# Patient Record
Sex: Female | Born: 1970 | Race: Black or African American | Hispanic: No | Smoking: Former smoker
Health system: Southern US, Community
[De-identification: ages and names within clinical notes are randomized; demographics above are authoritative.]

## PROBLEM LIST (undated history)

## (undated) DIAGNOSIS — M1990 Unspecified osteoarthritis, unspecified site: Secondary | ICD-10-CM

## (undated) DIAGNOSIS — R350 Frequency of micturition: Secondary | ICD-10-CM

## (undated) DIAGNOSIS — D649 Anemia, unspecified: Secondary | ICD-10-CM

## (undated) DIAGNOSIS — D259 Leiomyoma of uterus, unspecified: Secondary | ICD-10-CM

## (undated) DIAGNOSIS — IMO0001 Reserved for inherently not codable concepts without codable children: Secondary | ICD-10-CM

## (undated) DIAGNOSIS — R32 Unspecified urinary incontinence: Secondary | ICD-10-CM

## (undated) HISTORY — DX: Unspecified urinary incontinence: R32

## (undated) HISTORY — DX: Leiomyoma of uterus, unspecified: D25.9

## (undated) HISTORY — DX: Anemia, unspecified: D64.9

## (undated) HISTORY — PX: OTHER SURGICAL HISTORY: SHX169

---

## 1993-09-17 HISTORY — PX: KNEE ARTHROSCOPY: SUR90

## 1994-09-17 HISTORY — PX: PELVIC LAPAROSCOPY: SHX162

## 1996-09-17 DIAGNOSIS — D259 Leiomyoma of uterus, unspecified: Secondary | ICD-10-CM

## 1996-09-17 HISTORY — DX: Leiomyoma of uterus, unspecified: D25.9

## 1998-03-01 ENCOUNTER — Other Ambulatory Visit: Admission: RE | Admit: 1998-03-01 | Discharge: 1998-03-01 | Payer: Self-pay | Admitting: Obstetrics and Gynecology

## 1998-04-01 ENCOUNTER — Other Ambulatory Visit: Admission: RE | Admit: 1998-04-01 | Discharge: 1998-04-01 | Payer: Self-pay | Admitting: Obstetrics and Gynecology

## 1998-07-22 ENCOUNTER — Ambulatory Visit (HOSPITAL_COMMUNITY): Admission: RE | Admit: 1998-07-22 | Discharge: 1998-07-22 | Payer: Self-pay | Admitting: Obstetrics and Gynecology

## 1998-07-22 ENCOUNTER — Encounter: Payer: Self-pay | Admitting: Obstetrics and Gynecology

## 1998-09-25 ENCOUNTER — Inpatient Hospital Stay (HOSPITAL_COMMUNITY): Admission: AD | Admit: 1998-09-25 | Discharge: 1998-09-25 | Payer: Self-pay | Admitting: Obstetrics and Gynecology

## 1998-09-27 ENCOUNTER — Inpatient Hospital Stay (HOSPITAL_COMMUNITY): Admission: AD | Admit: 1998-09-27 | Discharge: 1998-09-27 | Payer: Self-pay | Admitting: Obstetrics & Gynecology

## 1998-10-04 ENCOUNTER — Inpatient Hospital Stay (HOSPITAL_COMMUNITY): Admission: AD | Admit: 1998-10-04 | Discharge: 1998-10-07 | Payer: Self-pay | Admitting: Obstetrics and Gynecology

## 2001-09-29 ENCOUNTER — Other Ambulatory Visit: Admission: RE | Admit: 2001-09-29 | Discharge: 2001-09-29 | Payer: Self-pay | Admitting: Obstetrics and Gynecology

## 2002-10-15 ENCOUNTER — Other Ambulatory Visit: Admission: RE | Admit: 2002-10-15 | Discharge: 2002-10-15 | Payer: Self-pay | Admitting: Obstetrics and Gynecology

## 2004-01-21 ENCOUNTER — Encounter (INDEPENDENT_AMBULATORY_CARE_PROVIDER_SITE_OTHER): Payer: Self-pay | Admitting: Specialist

## 2004-01-21 ENCOUNTER — Inpatient Hospital Stay (HOSPITAL_COMMUNITY): Admission: RE | Admit: 2004-01-21 | Discharge: 2004-01-24 | Payer: Self-pay | Admitting: Obstetrics and Gynecology

## 2004-01-21 HISTORY — PX: TUBAL LIGATION: SHX77

## 2004-01-25 ENCOUNTER — Encounter: Admission: RE | Admit: 2004-01-25 | Discharge: 2004-02-24 | Payer: Self-pay | Admitting: Obstetrics and Gynecology

## 2004-02-25 ENCOUNTER — Encounter: Admission: RE | Admit: 2004-02-25 | Discharge: 2004-03-26 | Payer: Self-pay | Admitting: Obstetrics and Gynecology

## 2004-03-02 ENCOUNTER — Other Ambulatory Visit: Admission: RE | Admit: 2004-03-02 | Discharge: 2004-03-02 | Payer: Self-pay | Admitting: Obstetrics and Gynecology

## 2009-03-15 ENCOUNTER — Emergency Department (HOSPITAL_BASED_OUTPATIENT_CLINIC_OR_DEPARTMENT_OTHER): Admission: EM | Admit: 2009-03-15 | Discharge: 2009-03-16 | Payer: Self-pay | Admitting: Emergency Medicine

## 2009-03-16 ENCOUNTER — Ambulatory Visit: Payer: Self-pay | Admitting: Radiology

## 2009-07-14 ENCOUNTER — Ambulatory Visit (HOSPITAL_COMMUNITY): Admission: RE | Admit: 2009-07-14 | Discharge: 2009-07-14 | Payer: Self-pay | Admitting: Pulmonary Disease

## 2010-07-13 IMAGING — US US RENAL
1 series · 14 of 25 positions shown · non-contrast
Comparison: 03/16/2009 study.

CLINICAL DATA: History of hematuria.

RENAL/URINARY TRACT ULTRASOUND COMPLETE

[Series 1: us renal · 0.30mm/px · 14 of 33 slices shown]
[im 1/33]
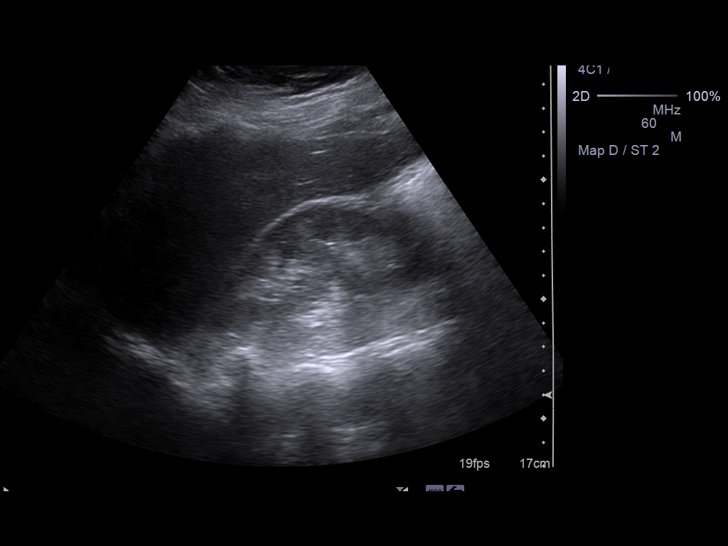
[im 3/33]
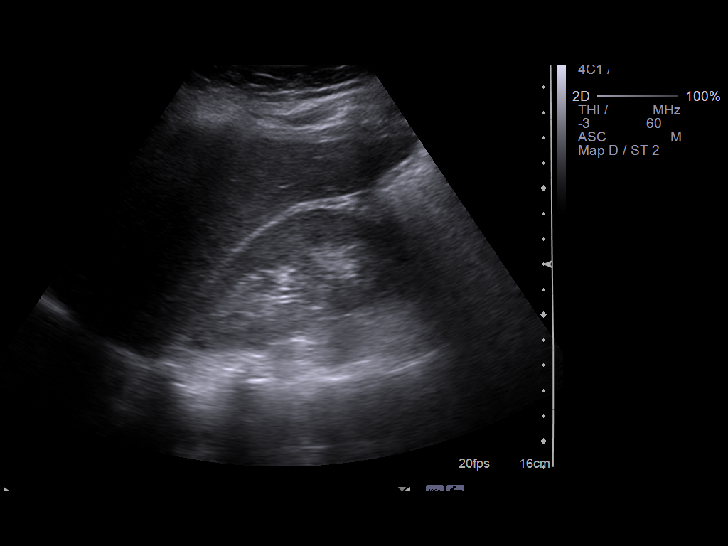
[im 6/33]
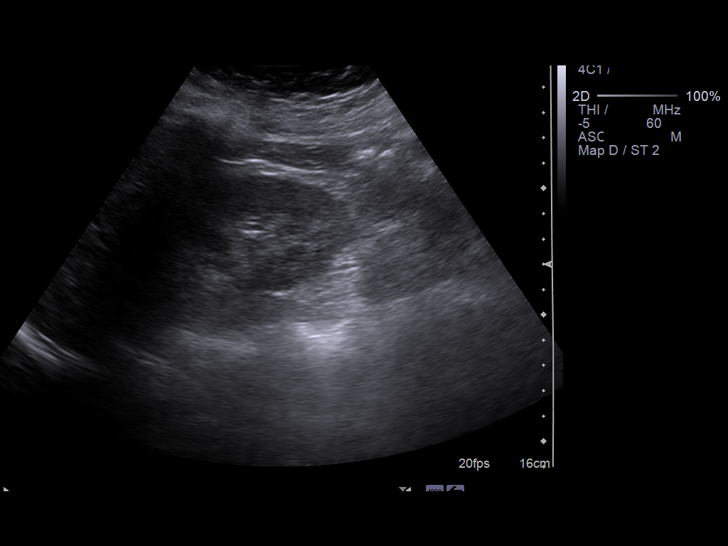
[im 9/33]
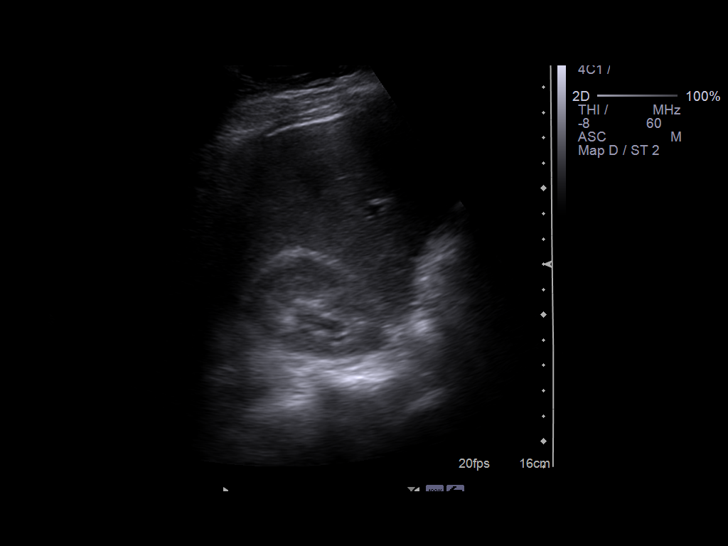
[im 11/33]
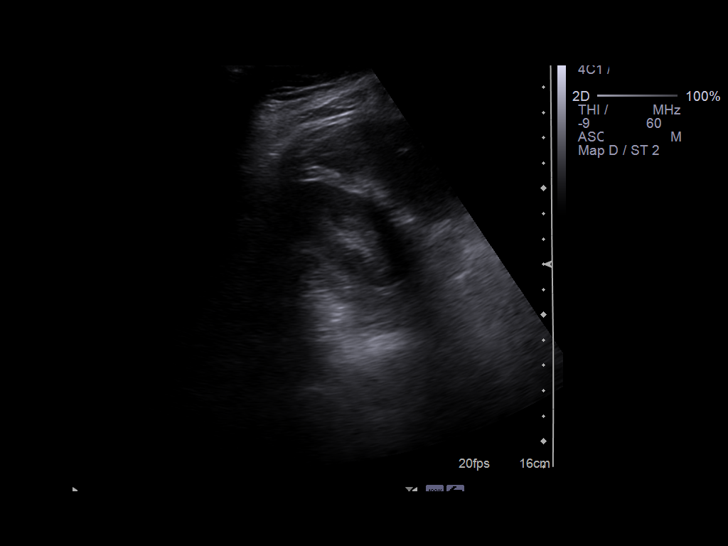
[im 13/33]
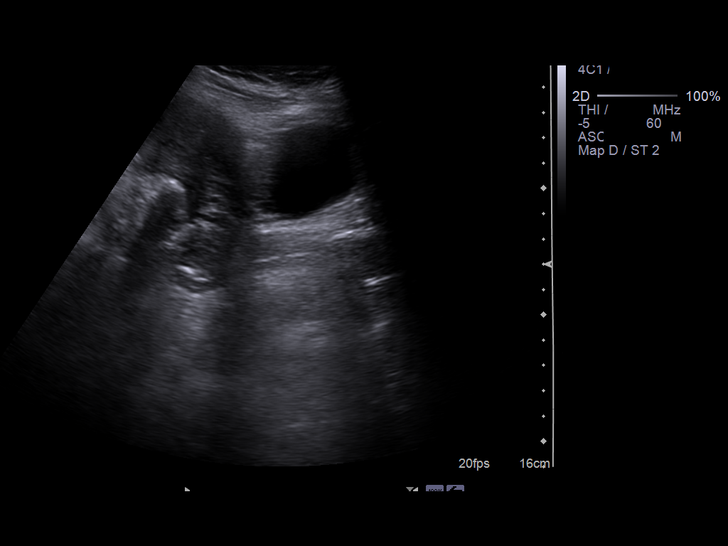
[im 15/33]
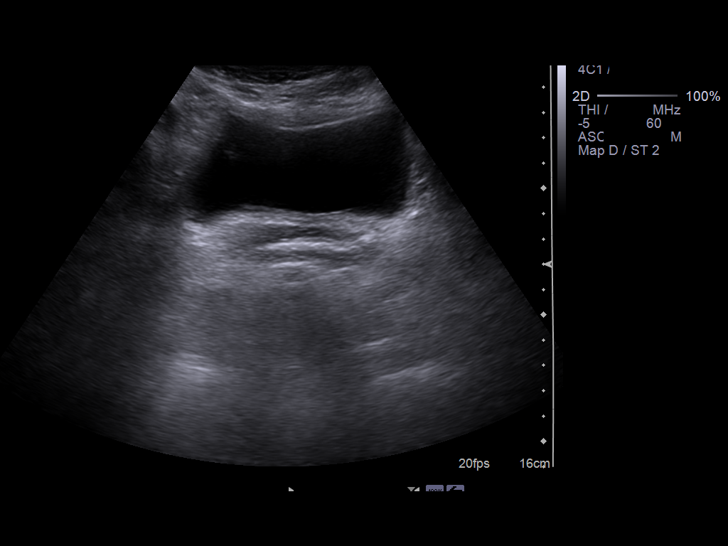
[im 18/33]
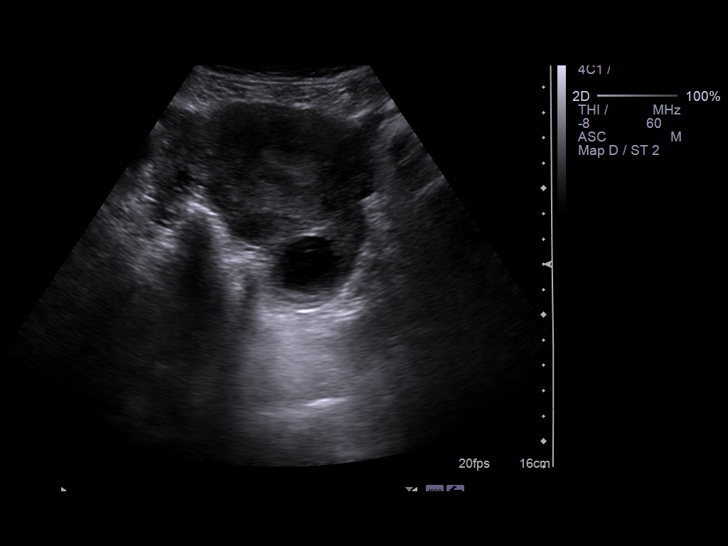
[im 21/33]
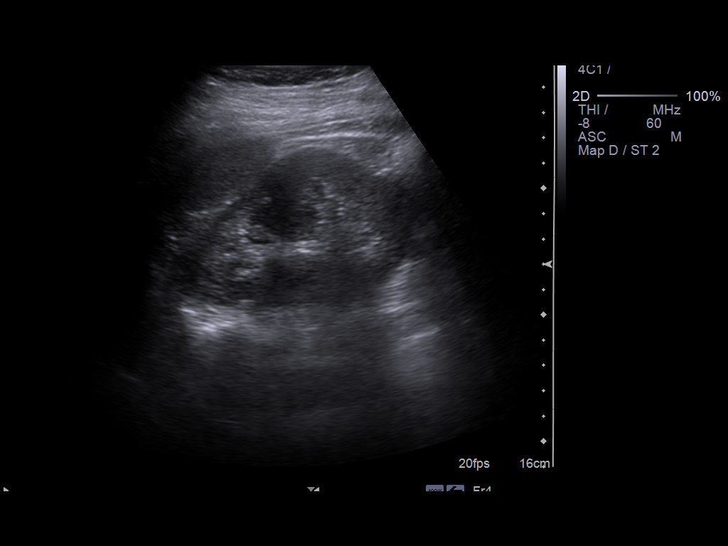
[im 22/33]
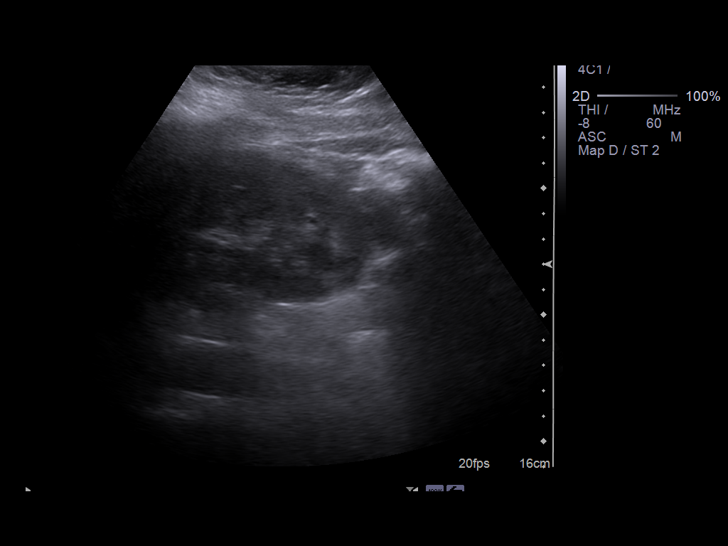
[im 25/33]
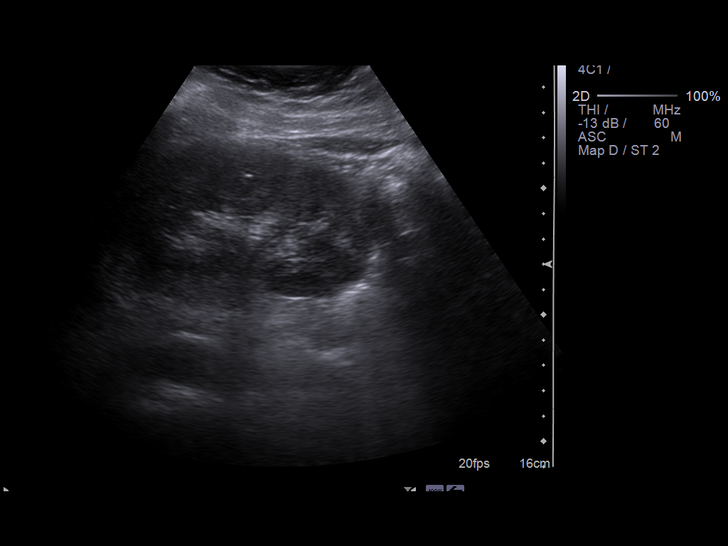
[im 27/33]
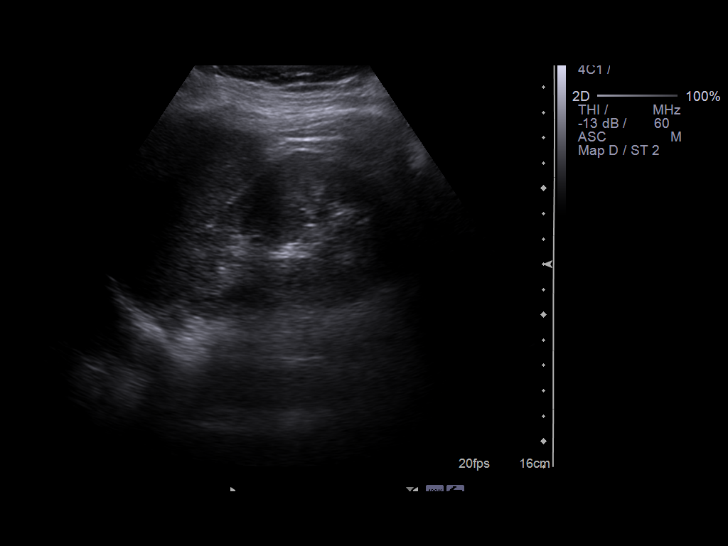
[im 30/33]
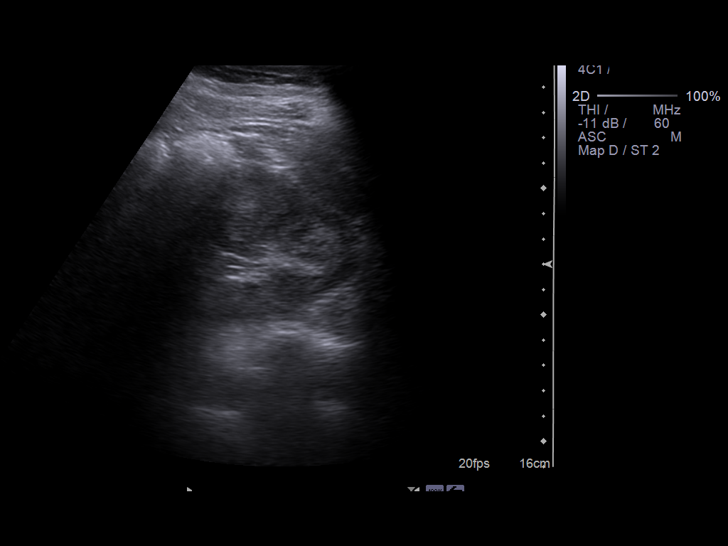
[im 33/33]
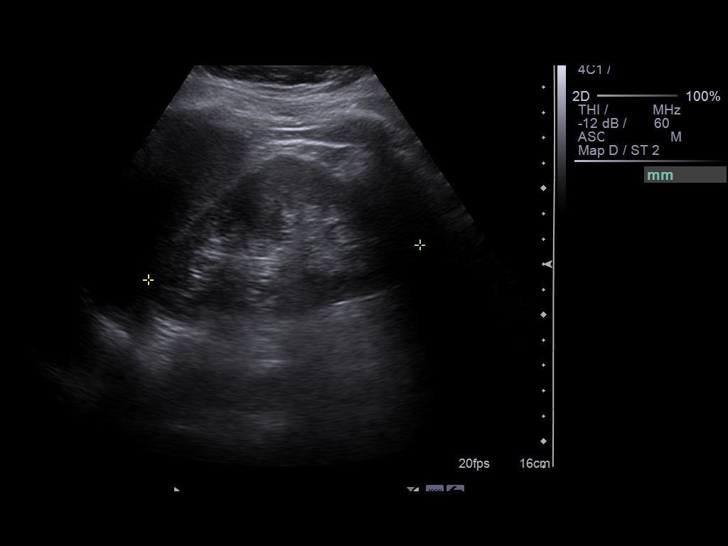

[14 of 25 positions shown; findings below may reference images not displayed]

FINDINGS: Right Kidney:  Right renal length is 10.7 cm.

Left Kidney:  Left renal length is 10.8 cm.

No hydronephrosis, cyst, solid mass, calculus, parenchymal loss, or
parenchymal texture abnormality is seen.

Bladder:  No bladder abnormality is demonstrated.

Incidental note is made of a complex ovarian cyst measuring 2.5 x
2.4 x 2.3 cm on the left.  This has thick wall with peripheral
color flow consistent with probable corpus luteum.
IMPRESSION: No renal or bladder abnormality is demonstrated.  Left ovarian
cystic structure is discussed above.

## 2010-12-25 LAB — URINALYSIS, ROUTINE W REFLEX MICROSCOPIC
Bilirubin Urine: NEGATIVE
Glucose, UA: NEGATIVE mg/dL
Hgb urine dipstick: NEGATIVE
Ketones, ur: NEGATIVE mg/dL
Nitrite: NEGATIVE
Protein, ur: NEGATIVE mg/dL
Specific Gravity, Urine: 1.015 (ref 1.005–1.030)
Urobilinogen, UA: 0.2 mg/dL (ref 0.0–1.0)
pH: 5.5 (ref 5.0–8.0)

## 2010-12-25 LAB — LIPASE, BLOOD: Lipase: 113 U/L (ref 23–300)

## 2010-12-25 LAB — DIFFERENTIAL
Basophils Absolute: 0.1 10*3/uL (ref 0.0–0.1)
Basophils Relative: 1 % (ref 0–1)
Eosinophils Absolute: 0.2 10*3/uL (ref 0.0–0.7)
Eosinophils Relative: 2 % (ref 0–5)
Lymphocytes Relative: 42 % (ref 12–46)
Monocytes Absolute: 0.4 10*3/uL (ref 0.1–1.0)

## 2010-12-25 LAB — COMPREHENSIVE METABOLIC PANEL
ALT: 15 U/L (ref 0–35)
AST: 23 U/L (ref 0–37)
Albumin: 4 g/dL (ref 3.5–5.2)
Alkaline Phosphatase: 67 U/L (ref 39–117)
CO2: 25 mEq/L (ref 19–32)
Chloride: 107 mEq/L (ref 96–112)
Creatinine, Ser: 0.9 mg/dL (ref 0.4–1.2)
GFR calc Af Amer: >60 mL/min (ref >60)
GFR calc non Af Amer: >60 mL/min (ref >60)
Potassium: 3.9 mEq/L (ref 3.5–5.1)
Total Bilirubin: 0.5 mg/dL (ref 0.3–1.2)

## 2010-12-25 LAB — CBC
HCT: 42.1 % (ref 36.0–46.0)
Hemoglobin: 14.3 g/dL (ref 12.0–15.0)
MCHC: 33.8 g/dL (ref 30.0–36.0)
MCV: 93.8 fL (ref 78.0–100.0)
Platelets: 220 10*3/uL (ref 150–400)
RBC: 4.49 MIL/uL (ref 3.87–5.11)
RDW: 13 % (ref 11.5–15.5)
WBC: 7.6 10*3/uL (ref 4.0–10.5)

## 2010-12-25 LAB — PREGNANCY, URINE: Preg Test, Ur: NEGATIVE

## 2011-02-02 NOTE — Discharge Summary (Signed)
NAME:  Jean English, Jean English                       ACCOUNT NO.:  [id number]   MEDICAL RECORD NO.:  [id number]                   PATIENT TYPE:  INP   LOCATION:  9141                                 FACILITY:  [facility]   PHYSICIAN:  Zenaida Niece, M.D.             DATE OF BIRTH:  27-May-1971   DATE OF ADMISSION:  01/21/2004  DATE OF DISCHARGE:                                 DISCHARGE SUMMARY   ADMISSION DIAGNOSIS:  Intrauterine pregnancy at 39 weeks, previous cesarean  section, and desires surgical sterility.   PROCEDURES:  On Jan 21, 2004 she had a repeat low transverse cesarean section  and bilateral partial salpingectomy.   HISTORY AND PHYSICAL:  Please see chart for full history and physical but  briefly, this is a 40 year old black female gravida 2 para 1-0-0-1 with an  EGA of [redacted] weeks who presents for repeat cesarean section.  She also wants  tubal ligation.  Physical exam significant for a benign abdomen with a low,  well-healed transverse scar and cervix was 1, 50, and -3 with a vertex  presentation.   HOSPITAL COURSE:  The patient was admitted and underwent cesarean section  and tubal ligation under spinal anesthesia without complications.  She  delivered a viable female infant with Apgars of 9 and 9 that weighed 8 pounds  11 ounces.  Postoperatively she did well except for postoperative day #1  where she had some vertigo and nausea and one postoperative temperature to  101.  The symptoms rapidly resolved and she had no further complications.  Predelivery hemoglobin 12.4, postdelivery 11.9.  On the morning of  postoperative day #3 she was felt to be stable enough for discharge home.  Her incision was healing well and prior to discharge the nurse was to remove  staples and apply Steri-Strips.   DISCHARGE INSTRUCTIONS:  1. Regular diet, pelvic rest, no strenuous activity.  2. Follow-up is in approximately 2 weeks.  3. Medications are Percocet #30 one to two p.o. q.4-6h. p.r.n. pain  and over-     the-counter ibuprofen as needed.  4. She is given our discharge pamphlet.                                               Zenaida Niece, M.D.    TDM/MEDQ  D:  01/24/2004  T:  01/24/2004  Job:  [id number]
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
## 2011-02-02 NOTE — Op Note (Signed)
NAME:  Jean English, Jean English                       ACCOUNT NO.:  [id number]   MEDICAL RECORD NO.:  [id number]                   PATIENT TYPE:  INP   LOCATION:  9198                                 FACILITY:  [facility]   PHYSICIAN:  Zenaida Niece, M.D.             DATE OF BIRTH:  October 10, 1970   DATE OF PROCEDURE:  01/21/2004  DATE OF DISCHARGE:                                 OPERATIVE REPORT   PREOPERATIVE DIAGNOSES:  Intrauterine pregnancy at 39 weeks, previous  cesarean section, desires surgical sterility.   POSTOPERATIVE DIAGNOSES:  Intrauterine pregnancy at 39 weeks, previous  cesarean section, desires surgical sterility.   PROCEDURE:  Repeat low transverse cesarean section and bilateral partial  salpingectomy.   SURGEON:  Zenaida Niece, M.D.   ASSISTANT:  Huel Cote, M.D.   ANESTHESIA:  Spinal.   ESTIMATED BLOOD LOSS:  800 cc.   FINDINGS:  Normal anatomy with symmetrically enlarged ovaries.  She  delivered a viable female infant with Apgars of 9 and 9 and weighed 8 pounds  11 ounces.   PROCEDURE IN DETAIL:  The patient was taken to the operating room and placed  in the sitting position.  Dr. Harvest Forest instilled spinal anesthesia and she was  placed in the dorsal supine position with left lateral tilt.  The abdomen  was prepped and draped in the usual sterile fashion and a Foley catheter  inserted.  The level of her anesthesia was found to be adequate.  Her lower  abdomen was inspected.  Her previous transverse scar appeared to be just  above the pubic bone and in a crease.  I thus decided to go above this.  Her  abdomen was then entered via a standard Pfannenstiel incision.  Bladder  blade was placed and a 4 cm transverse incision was made in the lower  uterine segment.  Bladder was pushed inferior.  Clear amniotic fluid was  noted upon entering the amniotic cavity.  The uterine incision was extended  bilaterally digitally.  The fetal vertex was grasped and delivered  through  the incision atraumatically.  The mouth and nares were suctioned.  The  remainder of the infant then delivered atraumatically.  Cord was doubly  clamped and cut and the infant handed to the awaiting pediatric team.  Cord  blood and a segment of cord for gas were obtained and the placenta delivered  spontaneously.  The uterus was wiped dry with a clean lap pad and palpated  normal.  The uterine incision was inspected and found to be free of  extensions.  The cervix was dilated with a long ring forceps.  The uterine  incision was then closed in one layer, being running locking layer with #1  chromic.  Halfway through the incision from the patient's left to right, the  needle was contaminated.  Thus this suture was cut and held and a suture was  started at the right angle and run  to the middle and these sutures were tied  together.  Bleeding from the serosal edges was controlled with  electrocautery.   Attention was turned to the tubal ligation.  Both fallopian tubes were  identified and traced to their fimbriated ends.  A knuckle of tube on each  side was grasped with the Babcock clamp.  A window was made with  electrocautery in an avascular portion of the mesosalpinx.  A 0 plain gut  suture was passed through this loop, tied around one end of the tube and  then wrapped around the other end of the tube to create a knuckle.  This  knuckle of tube was then removed sharply.  On both sides, both ostia were  identified and the stumps were hemostatic.  Uterine incision was again  inspected and found to be hemostatic.  The subfascial space was then  irrigated and made hemostatic with electrocautery.   The fascia was closed in a running fashion starting at both ends and meeting  in the middle with 0 Vicryl.  The subcutaneous tissue was irrigated and made  hemostatic with electrocautery.  The skin was then closed with staples and a  sterile dressing.  The patient tolerated the procedure  well and was taken to  the recovery room in stable condition.  Counts were correct x 2 and the  patient received Ancef 1 g after cord clamp.                                               Zenaida Niece, M.D.    TDM/MEDQ  D:  01/21/2004  T:  01/21/2004  Job:  [id number]

## 2011-05-25 ENCOUNTER — Ambulatory Visit (INDEPENDENT_AMBULATORY_CARE_PROVIDER_SITE_OTHER): Payer: 59 | Admitting: Gynecology

## 2011-05-25 ENCOUNTER — Encounter: Payer: Self-pay | Admitting: Gynecology

## 2011-05-25 ENCOUNTER — Telehealth: Payer: Self-pay | Admitting: *Deleted

## 2011-05-25 ENCOUNTER — Other Ambulatory Visit (HOSPITAL_COMMUNITY)
Admission: RE | Admit: 2011-05-25 | Discharge: 2011-05-25 | Disposition: A | Discharge status: Home / Self Care | Payer: 59 | Source: Ambulatory Visit | Attending: Pediatrics | Admitting: Pediatrics

## 2011-05-25 VITALS — BP 132/90 | Ht 66.0 in | Wt 212.0 lb

## 2011-05-25 DIAGNOSIS — N938 Other specified abnormal uterine and vaginal bleeding: Secondary | ICD-10-CM

## 2011-05-25 DIAGNOSIS — E7800 Pure hypercholesterolemia, unspecified: Secondary | ICD-10-CM

## 2011-05-25 DIAGNOSIS — R635 Abnormal weight gain: Secondary | ICD-10-CM

## 2011-05-25 DIAGNOSIS — Z13220 Encounter for screening for lipoid disorders: Secondary | ICD-10-CM

## 2011-05-25 DIAGNOSIS — N949 Unspecified condition associated with female genital organs and menstrual cycle: Secondary | ICD-10-CM

## 2011-05-25 DIAGNOSIS — N393 Stress incontinence (female) (male): Secondary | ICD-10-CM | POA: Insufficient documentation

## 2011-05-25 DIAGNOSIS — Z01419 Encounter for gynecological examination (general) (routine) without abnormal findings: Secondary | ICD-10-CM

## 2011-05-25 DIAGNOSIS — Z131 Encounter for screening for diabetes mellitus: Secondary | ICD-10-CM

## 2011-05-25 NOTE — Telephone Encounter (Signed)
PT INFORMED WITH THE BELOW NOTE, ORDER IN COMPUTER, PT WILL MAKE APPOINTMENT.

## 2011-05-25 NOTE — Progress Notes (Signed)
Jean English 06-22-1971 [id number]   History:    40 y.o.  for annual exam as a new patient. She has been pregnant 2 times delivered via cesarean section the last pregnancy resulting bilateral tubal sterilization procedure. She also stated in 1998 she had a diagnostic laparoscopy before her pregnancy and she was diagnosed with endometriosis. She had several complaints today #1 was gaining weight #2 dysfunction uterine bleeding whereby she's been having 2 periods per month over the course of past 3-4 months. #3 complaining of stress urinary incontinence when she jumps sneezes or exercises she leaks urine and wears a pad throughout the day. She denies any nocturia. Her last gynecological examination was 2 years ago. She denied any prior abnormal Pap smears. She does her monthly self breast examinations. She does not have any history of any past mammograms. Patient with strong family history of diabetes. Past medical history,surgical history, family history and social history were all reviewed and documented in the EPIC chart.  ROS:  Was performed and pertinent positives and negatives are included in the history.  Exam: chaperone present BP 132/90  Ht 5\' 6"  (1.676 m)  Wt 212 lb (96.163 kg)  BMI 34.22 kg/m2  LMP 04/30/2011  Body mass index is 34.22 kg/(m^2).  General appearance : Well developed well nourished female. No acute distress HEENT: Neck supple, trachea midline, no carotid bruits, no thyroidmegaly Lungs: Clear to auscultation, no rhonchi or wheezes, or rib retractions  Heart: Regular rate and rhythm, no murmurs or gallops Breast:Examined in sitting and supine position were symmetrical in appearance, no palpable masses or tenderness,  no skin retraction, no nipple inversion, no nipple discharge, no skin discoloration, no axillary or supraclavicular lymphadenopathy Abdomen: no palpable masses or tenderness, no rebound or guarding Extremities: no edema or skin discoloration or  tenderness  Pelvic:  Bartholin, Urethra, Skene Glands: Within normal limits             Vagina: No gross lesions or discharge  Cervix: No gross lesions or discharge  Uterus  enlarged approximately 8 week size deviated to the right, non-tender and mobile  Adnexa  Without masses or tenderness  Anus and perineum  normal   Rectovaginal  normal sphincter tone without palpated masses or tenderness             Hemoccult not done   Q-tip angle tests less than 30 Mild first-degree cystocele During Valsalva she did not leak in the supine or erect position     Assessment/Plan:  40 y.o. female for annual exam and large uterus approximately 8 weeks size deviated to patient's right mobile and nontender. Patient underwent an endometrial biopsy after the cervix was cleansed with Betadine solution and she was counseled. Pipelle was inserted into the uterus which measured approximately 8 cm and moderate amount of tissue was obtained and was submitted for histological evaluation. We'll obtain a TSH, random blood sugar, cholesterol, CBC urinalysis along with her Pap smear today. She will return back next week to discuss the results of endometrial biopsy and plan on doing an ultrasound/sono hysterogram. After evaluation for dysfunctional uterine bleeding is completed we'll proceed with doing the urodynamic evaluation here in the office. At age 83 she'll have her baseline mammogram.    Ok Edwards MD, 11:53 AM 05/25/2011

## 2011-05-25 NOTE — Progress Notes (Signed)
Addended by: Bertram Savin A on: 05/25/2011 12:02 PM   Modules accepted: Orders

## 2011-05-25 NOTE — Telephone Encounter (Signed)
Message copied by Aura Camps on Fri May 25, 2011  3:33 PM ------      Message from: Ok Edwards      Created: Fri May 25, 2011  1:59 PM       Victorino Dike, please have patient come in sometime next week for a fasting lipid profile her total cholesterol was elevated it was 204 cut office 200.

## 2011-05-28 ENCOUNTER — Ambulatory Visit: Payer: 59 | Admitting: *Deleted

## 2011-05-29 ENCOUNTER — Telehealth: Payer: Self-pay | Admitting: *Deleted

## 2011-05-29 ENCOUNTER — Other Ambulatory Visit (INDEPENDENT_AMBULATORY_CARE_PROVIDER_SITE_OTHER): Payer: 59 | Admitting: Gynecology

## 2011-05-29 DIAGNOSIS — E7800 Pure hypercholesterolemia, unspecified: Secondary | ICD-10-CM

## 2011-05-29 NOTE — Telephone Encounter (Signed)
Message copied by Aura Camps on Tue May 29, 2011 10:22 AM ------      Message from: Ok Edwards      Created: Mon May 28, 2011 11:01 PM       Victorino Dike, please tell patient when she comes in for sonohysterogram that we need for her to have a fasting lipid profile because her total cholesterol was elevated. Thanks

## 2011-05-29 NOTE — Telephone Encounter (Signed)
PT INFORMED WITH THE BELOW NOTE, ORDER IN COMPUTER 

## 2011-06-12 ENCOUNTER — Ambulatory Visit: Admission: RE | Admit: 2011-06-12 | Payer: 59 | Source: Ambulatory Visit

## 2011-06-12 ENCOUNTER — Other Ambulatory Visit: Payer: Self-pay | Admitting: Gynecology

## 2011-06-12 ENCOUNTER — Encounter: Payer: Self-pay | Admitting: Gynecology

## 2011-06-12 ENCOUNTER — Ambulatory Visit (INDEPENDENT_AMBULATORY_CARE_PROVIDER_SITE_OTHER): Payer: 59 | Admitting: Gynecology

## 2011-06-12 DIAGNOSIS — D259 Leiomyoma of uterus, unspecified: Secondary | ICD-10-CM | POA: Insufficient documentation

## 2011-06-12 DIAGNOSIS — N393 Stress incontinence (female) (male): Secondary | ICD-10-CM

## 2011-06-12 DIAGNOSIS — N938 Other specified abnormal uterine and vaginal bleeding: Secondary | ICD-10-CM

## 2011-06-12 DIAGNOSIS — N949 Unspecified condition associated with female genital organs and menstrual cycle: Secondary | ICD-10-CM

## 2011-06-12 NOTE — Patient Instructions (Signed)
Jean English will call you to schedule surgery for October. Read the information that was provided on procedure.

## 2011-06-12 NOTE — Progress Notes (Signed)
Patient is a 40 year old who has had 2 prior cesarean sections and tubal ligation with her last C-section was seen in the office on 05/25/2011 complaining for the past several months of having 2 periods in a month. Last week she had an endometrial biopsy and the following report was given by the pathologist:  POLYPOID SECRETORY PHASE ENDOMETRIUM. - NEGATIVE FOR HYPERPLASIA OR MALIGNANCY.  Today the patient presented for a sonohysterogram for better assessment of her intrauterine cavity. The ultrasound demonstrated uterus that measured 10.6 x 6.2 x 6.0 cm endometrial stripe of 12.2 mm. 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 The sonohysterogram demonstrated an anterior left defect in the posterior left 9 x 6 mm suspicious for endometrial polyp and/or submucous myoma.  The findings were discussed with the patient and the recommendation was to proceed with an outpatient resectoscopic polypectomy/myomectomy which she would like to schedule for sometime in October in the middle the week so that she can return back to work shortly thereafter. Literature information was provided the risks benefits and pros and cons of the operation were discussed and we'll schedule accordingly. Her recent labs were normal. Grossly

## 2011-06-14 ENCOUNTER — Other Ambulatory Visit: Payer: Self-pay | Admitting: Gynecology

## 2011-06-14 DIAGNOSIS — Z01818 Encounter for other preprocedural examination: Secondary | ICD-10-CM

## 2011-06-25 ENCOUNTER — Other Ambulatory Visit: Payer: 59

## 2011-06-26 ENCOUNTER — Other Ambulatory Visit (INDEPENDENT_AMBULATORY_CARE_PROVIDER_SITE_OTHER): Payer: 59 | Admitting: *Deleted

## 2011-06-26 ENCOUNTER — Telehealth: Payer: Self-pay | Admitting: *Deleted

## 2011-06-26 ENCOUNTER — Other Ambulatory Visit: Payer: 59

## 2011-06-26 DIAGNOSIS — Z01818 Encounter for other preprocedural examination: Secondary | ICD-10-CM

## 2011-06-26 NOTE — Telephone Encounter (Signed)
Pt called wanting to know if she has to have blood drawn prior to her surgery. Pt informed that she does and her appointment is today at 12:50pm

## 2011-06-27 ENCOUNTER — Ambulatory Visit (HOSPITAL_BASED_OUTPATIENT_CLINIC_OR_DEPARTMENT_OTHER)
Admission: RE | Admit: 2011-06-27 | Discharge: 2011-06-27 | Disposition: A | Discharge status: Home / Self Care | Payer: 59 | Source: Ambulatory Visit | Attending: Gynecology | Admitting: Gynecology

## 2011-06-27 ENCOUNTER — Other Ambulatory Visit: Payer: Self-pay | Admitting: Gynecology

## 2011-06-27 DIAGNOSIS — E669 Obesity, unspecified: Secondary | ICD-10-CM | POA: Insufficient documentation

## 2011-06-27 DIAGNOSIS — N840 Polyp of corpus uteri: Secondary | ICD-10-CM | POA: Insufficient documentation

## 2011-06-27 DIAGNOSIS — N949 Unspecified condition associated with female genital organs and menstrual cycle: Secondary | ICD-10-CM | POA: Insufficient documentation

## 2011-06-27 DIAGNOSIS — N938 Other specified abnormal uterine and vaginal bleeding: Secondary | ICD-10-CM | POA: Insufficient documentation

## 2011-06-27 HISTORY — PX: HYSTEROSCOPY: SHX211

## 2011-07-02 ENCOUNTER — Encounter: Payer: Self-pay | Admitting: Gynecology

## 2011-07-11 ENCOUNTER — Encounter: Payer: Self-pay | Admitting: Gynecology

## 2011-07-11 ENCOUNTER — Ambulatory Visit (INDEPENDENT_AMBULATORY_CARE_PROVIDER_SITE_OTHER): Payer: 59 | Admitting: Gynecology

## 2011-07-11 VITALS — BP 128/84

## 2011-07-11 DIAGNOSIS — Z9889 Other specified postprocedural states: Secondary | ICD-10-CM

## 2011-07-11 NOTE — H&P (Signed)
[name], English             ACCOUNT NO.:  [id number]  MEDICAL RECORD NO.:  [id number]  LOCATION:                                 FACILITY:  PHYSICIAN:  Lynsee Wands H. Lily Peer, M.D.DATE OF BIRTH:  10-Oct-1970  DATE OF ADMISSION:  06/27/2011 DATE OF DISCHARGE:                             HISTORY & PHYSICAL   The patient is scheduled for surgery on Wednesday, October 10, at 11:45 a.m. at [facility].  Please have the history and physical available.  CHIEF COMPLAINT: 1. Dysfunctional uterine bleeding. 2. Submucous polyp.  HISTORY:  The patient is a 40 year old, gravida 2, para 2, who was seen in the office on September 7, for her annual gynecological examination, who was brought up to my attention that she was having for the past several months 2 periods for a month.  She had undergone an endometrial biopsy in the office, which the pathology report demonstrated polypoid secretory phase endometrium.  Negative for hyperplasia or malignancy. She underwent a sonohysterogram, which demonstrated a uterine anterior left defect measuring 13 x 12 mm and posterior left uterine sidewall 9 x 6 mm consistent with endometrial polyps.  The patient is scheduled to undergo resectoscopic polypectomy.  ALLERGIES:  None reported.  MEDICATIONS:  Currently none.  FAMILY HISTORY:  Mother with diabetes.  Father with hypertension.  PAST SURGICAL HISTORY: 1. Cesarean section. 2. Arthroscopic knee surgery. 3. Diagnostic laparoscopy (endometriosis). 4. Tubal ligation.  SOCIAL HISTORY:  The patient is a former smoker.  Occasional alcohol socially and is employed by [employer].  PHYSICAL EXAMINATION:  VITAL SIGNS:  Blood pressure 132/90, height 5 feet 6 inches tall, weight 204 pounds, BMI 34.22. GENERAL APPEARANCE:  A well-developed, well-nourished female in no acute distress. NECK:  Supple.  Trachea midline.  No carotid bruits.  No thyromegaly. LUNGS:  Clear to auscultation.   No rhonchi.  No wheezes or rib retractions. HEART:  Regular rate and rhythm.  No murmurs or gallops. BREAST EXAM:  In the sitting and supine position were symmetrical in appearance.  No palpable masses or tenderness.  No skin retraction.  No nipple inversion or nipple discharge.  No skin discoloration.  No axillary or supraclavicular lymphadenopathy. ABDOMEN:  No palpable masses or tenderness.  No rebound or guarding. EXTREMITIES:  No edema, skin discoloration, or tenderness. PELVIC EXAM:  Bartholin, urethra, and Skene glands within normal limits. Vagina, no gross lesions or discharge.  Cervix, no gross lesions or discharge.  Uterus, enlarged approximately 6 to 8-week size, deviated to the right, nontender and mobile.  Adnexa without mass or tenderness. RECTAL EXAM:  Unremarkable.  Q-tip angle test was at 30 degrees, a mild first-degree cystocele was noted and there was no leakage of urine in the supine or erect position duringValsalva maneuver.  Sonohysterogram as described above, was done in the office on September 25.  Uterus measured 10 x 6 x 6 cm.  Endometrial stripe 4.2 mm. Intramural myomas 3 were noted and the largest 1 measuring 26 x 24 mm. The right and left ovary were otherwise normal.  Adnexa with no masses. Sonohysterogram anterior left uterine wall, polypoid like lesion measuring 13 x 12 mm and the posterior one in  the left.  Uterine wall measuring 9 x 6 mm.  ASSESSMENT:  A 40 year old, gravida 2, para 2 with dysfunctional uterine bleeding attributed to endometrial polyps.  Patient is scheduled to undergo resectoscopic polypectomy.  Risks, benefits, pros, and cons of the operation include infection, although she will receive prophylaxis antibiotic.  The risk of uterine perforation, requiring emergency hysterectomy, the risk of fluid extravasation, pulmonary edema were discussed.  Also, in the event that there would hemorrhage and the patient will need blood or blood  products.  She is fully aware of potential risk from donor blood or blood products to include anaphylactic reaction, hepatitis, and AIDS, and finally uncontrollable hemorrhage or perforation.  The patient may need to be followed up immediately with an emergency laparoscopy or a hysterectomy, which may require to remove one or both ovaries; and both ovaries are removed, she may need to be placed on hormone replacement therapy for several years thereafter.  All these issues were discussed with the patient in detail. Literature information was provided.  All questions were answered.  We will follow accordingly.  PLAN:  The patient is scheduled for resectoscopic polypectomy on Wednesday, October 10, at 11:45 at [facility].  Please have history and physical available.     Rudean Icenhour H. Lily Peer, M.D.     JHF/MEDQ  D:  06/26/2011  T:  06/27/2011  Job:  [id number]  Electronically Signed by Reynaldo Minium M.D. on 07/11/2011 02:28:51 PM

## 2011-07-11 NOTE — Op Note (Signed)
  [name], PATRON NO.:  [id number]  MEDICAL RECORD NO.:  [id number]  LOCATION:                               FACILITY:  [facility]  PHYSICIAN:  Juan H. Lily Peer, M.D.DATE OF BIRTH:  [birth year]  DATE OF PROCEDURE:  06/27/2011 DATE OF DISCHARGE:                              OPERATIVE REPORT   SURGEON:  Juan H. Lily Peer, MD  INDICATION FOR OPERATION:  A 40 year old gravida 2, para 2 with dysfunctional uterine bleeding, preoperative evaluation consisted of a sonohysterogram demonstrating 3 submucous polyps.  She had also had an endometrial biopsy which demonstrated polypoid secretory phase endometrium, negative for hyperplasia or malignancy.  PREOPERATIVE DIAGNOSES: 1. Dysfunctional bleeding. 2. Endometrial polyps.  POSTOPERATIVE DIAGNOSES: 1. Dysfunctional bleeding. 2. Endometrial polyps.  ANESTHESIA:  General endotracheal anesthesia.  PROCEDURES PERFORMED:  Resectoscopic polypectomy with the MyoSure hysteroscopic morcellator.  FINDINGS:  The patient had 3 endometrial polyps mostly in the lower uterine segment, one anteriorly located and the other two were located in right lateral and posterior locations.  DESCRIPTION OF OPERATION:  After the patient was adequately counseled, she was taken to the operating room where she underwent a successful general endotracheal anesthesia.  She had received a gram of Cefotan for prophylaxis.  The vagina and perineum were prepped and draped in the usual sterile fashion.  A red rubber Roxan Hockey had been inserted to evacuate the bladder of its contents for approximately 50 cc.  The uterus was slightly retroverted.  A single-tooth tenaculum was then placed on the anterior cervical lip and the cervix was dilated to a size 22 mm Pratt dilator.  Following this, the operative resectoscope with the MyoSure hysteroscopic morcellator were introduced into the intrauterine cavity.  Normal saline was the distending media.   A systematic inspection of the uterine cavity demonstrated a normal fundal region and both tubal ostia.  It was at this time that the lower uterine segment polyps, the one on the anterior lower uterine segment and two on the right uterine sidewall were noted.  With the MyoSure hysteroscopic morcellator, each of the polyps were individually resected and passed off the operative field for histological evaluation.  The patient tolerated the procedure well.  The single-tooth tenaculum was removed.  Fluid deficit from the normal saline distending media was 90 cc and the patient received a total of 1 L of lactated Ringer's.  She was extubated, transferred to recovery room with stable vital signs, and received 30 mg of Toradol en route to the recovery room.     Juan H. Lily Peer, M.D.     JHF/MEDQ  D:  06/27/2011  T:  06/27/2011  Job:  [id number]  Electronically Signed by Reynaldo Minium M.D. on 07/11/2011 02:28:59 PM

## 2011-07-12 NOTE — Progress Notes (Signed)
  Patient left without being seen by Physician. Office has been instructed to follow up on patient and have her rescheduled.ph

## 2011-07-13 ENCOUNTER — Encounter: Payer: Self-pay | Admitting: *Deleted

## 2014-07-19 ENCOUNTER — Encounter: Payer: Self-pay | Admitting: Gynecology

## 2014-07-26 ENCOUNTER — Ambulatory Visit (INDEPENDENT_AMBULATORY_CARE_PROVIDER_SITE_OTHER): Payer: 59 | Admitting: Gynecology

## 2014-07-26 ENCOUNTER — Telehealth: Payer: Self-pay

## 2014-07-26 ENCOUNTER — Encounter: Payer: Self-pay | Admitting: Gynecology

## 2014-07-26 ENCOUNTER — Other Ambulatory Visit (HOSPITAL_COMMUNITY)
Admission: RE | Admit: 2014-07-26 | Discharge: 2014-07-26 | Disposition: A | Discharge status: Home / Self Care | Payer: 59 | Source: Ambulatory Visit | Attending: Gynecology | Admitting: Gynecology

## 2014-07-26 VITALS — BP 122/78 | Ht 66.0 in | Wt 173.0 lb

## 2014-07-26 DIAGNOSIS — F5089 Other specified eating disorder: Secondary | ICD-10-CM | POA: Insufficient documentation

## 2014-07-26 DIAGNOSIS — F508 Other eating disorders: Secondary | ICD-10-CM

## 2014-07-26 DIAGNOSIS — Z1151 Encounter for screening for human papillomavirus (HPV): Secondary | ICD-10-CM | POA: Insufficient documentation

## 2014-07-26 DIAGNOSIS — D251 Intramural leiomyoma of uterus: Secondary | ICD-10-CM

## 2014-07-26 DIAGNOSIS — Z01419 Encounter for gynecological examination (general) (routine) without abnormal findings: Secondary | ICD-10-CM

## 2014-07-26 DIAGNOSIS — Z23 Encounter for immunization: Secondary | ICD-10-CM

## 2014-07-26 DIAGNOSIS — N920 Excessive and frequent menstruation with regular cycle: Secondary | ICD-10-CM | POA: Insufficient documentation

## 2014-07-26 DIAGNOSIS — F5083 Pica in adults: Secondary | ICD-10-CM | POA: Insufficient documentation

## 2014-07-26 DIAGNOSIS — N946 Dysmenorrhea, unspecified: Secondary | ICD-10-CM | POA: Insufficient documentation

## 2014-07-26 LAB — COMPREHENSIVE METABOLIC PANEL
ALT: 11 U/L (ref 0–35)
AST: 17 U/L (ref 0–37)
Albumin: 4.3 g/dL (ref 3.5–5.2)
Alkaline Phosphatase: 74 U/L (ref 39–117)
BUN: 13 mg/dL (ref 6–23)
CALCIUM: 9.1 mg/dL (ref 8.4–10.5)
CHLORIDE: 104 meq/L (ref 96–112)
CO2: 23 meq/L (ref 19–32)
CREATININE: 1.05 mg/dL (ref 0.50–1.10)
Glucose, Bld: 87 mg/dL (ref 70–99)
Potassium: 4.4 mEq/L (ref 3.5–5.3)
Sodium: 136 mEq/L (ref 135–145)
Total Bilirubin: 0.6 mg/dL (ref 0.2–1.2)
Total Protein: 6.6 g/dL (ref 6.0–8.3)

## 2014-07-26 LAB — CBC WITH DIFFERENTIAL/PLATELET
Basophils Absolute: 0 10*3/uL (ref 0.0–0.1)
Basophils Relative: 1 % (ref 0–1)
EOS PCT: 1 % (ref 0–5)
Eosinophils Absolute: 0 10*3/uL (ref 0.0–0.7)
HEMATOCRIT: 29.4 % — AB (ref 36.0–46.0)
HEMOGLOBIN: 9 g/dL — AB (ref 12.0–15.0)
LYMPHS ABS: 2.1 10*3/uL (ref 0.7–4.0)
LYMPHS PCT: 42 % (ref 12–46)
MCH: 21.8 pg — ABNORMAL LOW (ref 26.0–34.0)
MCHC: 30.6 g/dL (ref 30.0–36.0)
MCV: 71.2 fL — AB (ref 78.0–100.0)
MONO ABS: 0.4 10*3/uL (ref 0.1–1.0)
MONOS PCT: 9 % (ref 3–12)
Neutro Abs: 2.3 10*3/uL (ref 1.7–7.7)
Neutrophils Relative %: 47 % (ref 43–77)
Platelets: 459 10*3/uL — ABNORMAL HIGH (ref 150–400)
RBC: 4.13 MIL/uL (ref 3.87–5.11)
RDW: 17.6 % — ABNORMAL HIGH (ref 11.5–15.5)
WBC: 4.9 10*3/uL (ref 4.0–10.5)

## 2014-07-26 LAB — LIPID PANEL
CHOL/HDL RATIO: 2.4 ratio
CHOLESTEROL: 153 mg/dL (ref 0–200)
HDL: 64 mg/dL (ref >39)
LDL Cholesterol: 76 mg/dL (ref 0–99)
Triglycerides: 66 mg/dL (ref <150)
VLDL: 13 mg/dL (ref 0–40)

## 2014-07-26 LAB — TSH: TSH: 0.795 u[IU]/mL (ref 0.350–4.500)

## 2014-07-26 MED ORDER — TRANEXAMIC ACID 650 MG PO TABS: ORAL_TABLET | ORAL | Status: DC

## 2014-07-26 NOTE — Telephone Encounter (Signed)
Dr. Moshe Salisbury asked me to check ins benefits for patient regarding HO Ablation and Mirena IUD.  I did let ins rep know that Diagnosis was menorrhagia not contraception. The Rep. Iris, advised me the IUD and insertion covered at 100%.  The ablation procedure will apply to deductible/ coinsurance and patient's portion would be $3438. It is a calendar year plan. Patient will consider all of this and will be back in soon for an u/s.

## 2014-07-26 NOTE — Progress Notes (Signed)
Jean English 05-29-1971 [id number]   History:    43 y.o.  for annual gyn exam who has not been seen in the office since 2012. Patient is been complaining of worsening dysmenorrhea and menorrhagia.patient with past history of 2 cesarean sections and tubal ligation. Patient had an endometrial biopsy September 2012 with the following result because of having 2 periods per month at that time which demonstrated the following:  [place]. - NEGATIVE FOR HYPERPLASIA OR MALIGNANCY.   Patient had a sonohysterogram same year which demonstrated the following: The ultrasound demonstrated uterus that measured 10.6 x 6.2 x 6.0 cm endometrial stripe of 12.2 mm. 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 The sonohysterogram demonstrated an anterior left defect in the posterior left 9 x 6 mm suspicious for endometrial polyp and/or submucous myoma.  She underwent a resectoscopic polypectomy and benign endometrial polyps were removed with pathology report as follows: Endometrial polyp SECRETORY ENDOMETRIUM AND BENIGN ENDOMETRIAL POLYPS. NO HYPERPLASIA OR CARCINOMA.  Patient with no past history of abnormal Pap smears.patient has not had her baseline mammogram as of yet.  Past medical history,surgical history, family history and social history were all reviewed and documented in the EPIC chart.  Gynecologic History Patient's last menstrual period was 07/14/2014. Contraception: tubal ligation Last Pap: 2012. Results were: normal Last mammogram: no prior study. Results were: no prior study  Obstetric History OB History  Gravida Para Term Preterm AB SAB TAB Ectopic Multiple Living  2 2        2     # Outcome Date GA Lbr Len/2nd Weight Sex Delivery Anes PTL Lv  2 Para           1 Para                ROS: A ROS was performed and pertinent positives and negatives are included in the history.  GENERAL: No fevers  or chills. HEENT: No change in vision, no earache, sore throat or sinus congestion. NECK: No pain or stiffness. CARDIOVASCULAR: No chest pain or pressure. No palpitations. PULMONARY: No shortness of breath, cough or wheeze. GASTROINTESTINAL: No abdominal pain, nausea, vomiting or diarrhea, melena or bright red blood per rectum. GENITOURINARY: No urinary frequency, urgency, hesitancy or dysuria. MUSCULOSKELETAL: No joint or muscle pain, no back pain, no recent trauma. DERMATOLOGIC: No rash, no itching, no lesions. ENDOCRINE: No polyuria, polydipsia, no heat or cold intolerance. No recent change in weight. HEMATOLOGICAL: No anemia or easy bruising or bleeding. NEUROLOGIC: No headache, seizures, numbness, tingling or weakness. PSYCHIATRIC: No depression, no loss of interest in normal activity or change in sleep pattern.     Exam: chaperone present  BP 122/78 mmHg  Ht 5\' 6"  (1.676 m)  Wt 173 lb (78.472 kg)  BMI 27.94 kg/m2  LMP 07/14/2014  Body mass index is 27.94 kg/(m^2).  General appearance : Well developed well nourished female. No acute distress HEENT: Neck supple, trachea midline, no carotid bruits, no thyroidmegaly Lungs: Clear to auscultation, no rhonchi or wheezes, or rib retractions  Heart: Regular rate and rhythm, no murmurs or gallops Breast:Examined in sitting and supine position were symmetrical in appearance, no palpable masses or tenderness,  no skin retraction, no nipple inversion, no nipple discharge, no skin discoloration, no axillary or supraclavicular lymphadenopathy Abdomen: no palpable masses or tenderness, no rebound or guarding Extremities: no edema or skin discoloration or tenderness  Pelvic:  Bartholin, Urethra, Skene Glands: Within normal limits             Vagina: No gross lesions or discharge  Cervix: No gross lesions or discharge  Uterus  Enlarged, irregular shaped, 12-14 week size slightly tender uterus  Adnexa  Difficult to evaluate due to size of fibroid  uterus  Anus and perineum  normal   Rectovaginal  normal sphincter tone without palpated masses or tenderness             Hemoccult not indicated     Assessment/Plan:  43 y.o. female for annual exam with dysmenorrhea, menorrhagia, pica, and M on with his uteri which appears to increase in size from 2012. Patient will be scheduled for an ultrasound in the next couple weeks. The following labs were ordered today: CBC, TSH, fasting lipid profile, comprehensive metabolic panel, urinalysis and Pap smear. She will be prescribed Lysteda 650 mg 2 tablets 3 times a day during her menses. I've also presented her with information on endometrial ablation as well as the Mirena IUD although with the size of her uterus and probable need to have a hysterectomy. She was reminded to do her monthly breast exam. She was provided with a requisition to schedule her mammogram.   Terrance Mass MD, 11:47 AM 07/26/2014

## 2014-07-26 NOTE — Patient Instructions (Signed)
Transvaginal Ultrasound Transvaginal ultrasound is a pelvic ultrasound, using a metal probe that is placed in the vagina, to look at a women's female organs. Transvaginal ultrasound is a method of seeing inside the pelvis of a woman. The ultrasound machine sends out sound waves from the transducer (probe). These sound waves bounce off body structures (like an echo) to create a picture. The picture shows up on a monitor. It is called transvaginal because the probe is inserted into the vagina. There should be very little discomfort from the vaginal probe. This test can also be used during pregnancy. Endovaginal ultrasound is another name for a transvaginal ultrasound. In a transabdominal ultrasound, the probe is placed on the outside of the belly. This method gives pictures that are lower quality than pictures from the transvaginal technique. Transvaginal ultrasound is used to look for problems of the female genital tract. Some such problems include:  Infertility problems.  Congenital (birth defect) malformations of the uterus and ovaries.  Tumors in the uterus.  Abnormal bleeding.  Ovarian tumors and cysts.  Abscess (inflamed tissue around pus) in the pelvis.  Unexplained abdominal or pelvic pain.  Pelvic infection. DURING PREGNANCY, TRANSVAGINAL ULTRASOUND MAY BE USED TO LOOK AT:  Normal pregnancy.  Ectopic pregnancy (pregnancy outside the uterus).  Fetal heartbeat.  Abnormalities in the pelvis, that are not seen well with transabdominal ultrasound.  Suspected twins or multiples.  Impending miscarriage.  Problems with the cervix (incompetent cervix, not able to stay closed and hold the baby).  When doing an amniocentesis (removing fluid from the pregnancy sac, for testing).  Looking for abnormalities of the baby.  Checking the growth, development, and age of the fetus.  Measuring the amount of fluid in the amniotic sac.  When doing an external version of the baby (moving  baby into correct position).  Evaluating the baby for problems in high risk pregnancies (biophysical profile).  Suspected fetal demise (death). Sometimes a special ultrasound method called Saline Infusion Sonography (SIS) is used for a more accurate look at the uterus. Sterile saline (salt water) is injected into the uterus of non-pregnant patients to see the inside of the uterus better. SIS is not used on pregnant women. The vaginal probe can also assist in obtaining biopsies of abnormal areas, in draining fluid from cysts on the ovary, and in finding IUDs (intrauterine device, birth control) that cannot be located. PREPARATION FOR TEST A transvaginal ultrasound is done with the bladder empty. The transabdominal ultrasound is done with your bladder full. You may be asked to drink several glasses of water before that exam. Sometimes, a transabdominal ultrasound is done just after a transvaginal ultrasound, to look at organs in your abdomen. PROCEDURE  You will lie down on a table, with your knees bent and your feet in foot holders. The probe is covered with a condom. A sterile lubricant is put into the vagina and on the probe. The lubricant helps transmit the sound waves and avoid irritating the vagina. Your caregiver will move the probe inside the vaginal cavity to scan the pelvic structures. A normal test will show a normal pelvis and normal contents. An abnormal test will show abnormalities of the pelvis, placenta, or baby. ABNORMAL RESULTS MAY BE DUE TO:  Growths or tumors in the:  Uterus.  Ovaries.  Vagina.  Other pelvic structures.  Non-cancerous growths of the uterus and ovaries.  Twisting of the ovary, cutting off blood supply to the ovary (ovarian torsion).  Areas of infection, including:  Pelvic  inflammatory disease.  Abscess in the pelvis.  Locating an IUD. PROBLEMS FOUND IN PREGNANT WOMEN MAY INCLUDE:  Ectopic pregnancy (pregnancy outside the uterus).  Multiple  pregnancies.  Early dilation (opening) of the cervix. This may indicate an incompetent cervix and early delivery.  Impending miscarriage.  Fetal death.  Problems with the placenta, including:  Placenta has grown over the opening of the womb (placenta previa).  Placenta has separated early in the womb (placental abruption).  Placenta grows into the muscle of the uterus (placenta accreta).  Tumors of pregnancy, including gestational trophoblastic disease. This is an abnormal pregnancy, with no fetus. The uterus is filled with many grape-like cysts that could sometimes be cancerous.  Incorrect position of the fetus (breech, vertex).  Intrauterine fetal growth retardation (IUGR) (poor growth in the womb).  Fetal abnormalities or infection. RISKS AND COMPLICATIONS There are no known risks to the ultrasound procedure. There is no X-ray used when doing an ultrasound. Document Released: 08/15/2004 Document Revised: 11/26/2011 Document Reviewed: 08/03/2009 ExitCare Patient Information 2015 [employer]. This information is not intended to replace advice given to you by your health care provider. Make sure you discuss any questions you have with your health care provider.  Fibroids Fibroids are lumps (tumors) that can occur any place in a woman's body. These lumps are not cancerous. Fibroids vary in size, weight, and where they grow. HOME CARE  Do not take aspirin.  Write down the number of pads or tampons you use during your period. Tell your doctor. This can help determine the best treatment for you. GET HELP RIGHT AWAY IF:  You have pain in your lower belly (abdomen) that is not helped with medicine.  You have cramps that are not helped with medicine.  You have more bleeding between or during your period.  You feel lightheaded or pass out (faint).  Your lower belly pain gets worse. MAKE SURE YOU:  Understand these instructions.  Will watch your condition.  Will get  help right away if you are not doing well or get worse. Document Released: 10/06/2010 Document Revised: 11/26/2011 Document Reviewed: 10/06/2010 ExitCare Patient Information 2015 [employer]. This information is not intended to replace advice given to you by your health care provider. Make sure you discuss any questions you have with your health care provider.  

## 2014-07-27 LAB — URINALYSIS W MICROSCOPIC + REFLEX CULTURE
BILIRUBIN URINE: NEGATIVE
Casts: NONE SEEN
Crystals: NONE SEEN
Glucose, UA: NEGATIVE mg/dL
Hgb urine dipstick: NEGATIVE
KETONES UR: NEGATIVE mg/dL
Leukocytes, UA: NEGATIVE
NITRITE: NEGATIVE
PROTEIN: NEGATIVE mg/dL
RBC / HPF: NONE SEEN RBC/hpf (ref <3)
SPECIFIC GRAVITY, URINE: 1.018 (ref 1.005–1.030)
UROBILINOGEN UA: 0.2 mg/dL (ref 0.0–1.0)
pH: 5.5 (ref 5.0–8.0)

## 2014-07-27 LAB — CYTOLOGY - PAP

## 2014-07-28 LAB — URINE CULTURE
COLONY COUNT: NO GROWTH
ORGANISM ID, BACTERIA: NO GROWTH

## 2014-08-06 ENCOUNTER — Other Ambulatory Visit: Payer: Self-pay | Admitting: Gynecology

## 2014-08-06 ENCOUNTER — Ambulatory Visit (INDEPENDENT_AMBULATORY_CARE_PROVIDER_SITE_OTHER): Payer: 59

## 2014-08-06 ENCOUNTER — Encounter: Payer: Self-pay | Admitting: Gynecology

## 2014-08-06 ENCOUNTER — Ambulatory Visit (INDEPENDENT_AMBULATORY_CARE_PROVIDER_SITE_OTHER): Payer: 59 | Admitting: Gynecology

## 2014-08-06 DIAGNOSIS — D251 Intramural leiomyoma of uterus: Secondary | ICD-10-CM

## 2014-08-06 DIAGNOSIS — N946 Dysmenorrhea, unspecified: Secondary | ICD-10-CM

## 2014-08-06 DIAGNOSIS — D500 Iron deficiency anemia secondary to blood loss (chronic): Secondary | ICD-10-CM

## 2014-08-06 DIAGNOSIS — N920 Excessive and frequent menstruation with regular cycle: Secondary | ICD-10-CM

## 2014-08-06 NOTE — Patient Instructions (Signed)
Abdominal Hysterectomy Abdominal hysterectomy is a surgical procedure to remove your womb (uterus). Your uterus is the muscular organ that contains a developing baby. This surgery is done for many reasons. You may need an abdominal hysterectomy if you have cancer, growths (tumors), long-term pain, or bleeding. You may also have this procedure if your uterus has slipped down into your vagina (uterine prolapse). Depending on why you need an abdominal hysterectomy, you may also have other reproductive organs removed. These could include the part of your vagina that connects with your uterus (cervix), the organs that make eggs (ovaries), and the tubes that connect the ovaries to the uterus (fallopian tubes). LET [facility] CARE PROVIDER KNOW ABOUT:   Any allergies you have.  All medicines you are taking, including vitamins, herbs, eye drops, creams, and over-the-counter medicines.  Previous problems you or members of your family have had with the use of anesthetics.  Any blood disorders you have.  Previous surgeries you have had.  Medical conditions you have. RISKS AND COMPLICATIONS Generally, this is a safe procedure. However, as with any procedure, problems can occur. Infection is the most common problem after an abdominal hysterectomy. Other possible problems include:  Bleeding.  Formation of blood clots that may break free and travel to your lungs.  Injury to other organs near your uterus.  Nerve injury causing nerve pain.  Decreased interest in sex or pain during sexual intercourse. BEFORE THE PROCEDURE  Abdominal hysterectomy is a major surgical procedure. It can affect the way you feel about yourself. Talk to your health care provider about the physical and emotional changes hysterectomy may cause.  You may need to have blood work and X-rays done before surgery.  Quit smoking if you smoke. Ask your health care provider for help if you are struggling to quit.  Stop taking  medicines that thin your blood as directed by your health care provider.  You may be instructed to take antibiotic medicines or laxatives before surgery.  Do not eat or drink anything for 6-8 hours before surgery.  Take your regular medicines with a small sip of water.  Bathe or shower the night or morning before surgery. PROCEDURE  Abdominal hysterectomy is done in the operating room at the hospital.  In most cases, you will be given a medicine that makes you go to sleep (general anesthetic).  The surgeon will make a cut (incision) through the skin in your lower belly.  The incision may be about 5-7 inches long. It may go side-to-side or up-and-down.  The surgeon will move aside the body tissue that covers your uterus. The surgeon will then carefully take out your uterus along with any of your other reproductive organs that need to be removed.  Bleeding will be controlled with clamps or sutures.  The surgeon will close your incision with sutures or metal clips. AFTER THE PROCEDURE  You will have some pain immediately after the procedure.  You will be given pain medicine in the recovery room.  You will be taken to your hospital room when you have recovered from the anesthesia.  You may need to stay in the hospital for 2-5 days.  You will be given instructions for recovery at home. Document Released: 09/08/2013 Document Reviewed: 09/08/2013 [facility] Patient Information 2015 [place], [place]. This information is not intended to replace advice given to you by your health care provider. Make sure you discuss any questions you have with your health care provider.

## 2014-08-06 NOTE — Progress Notes (Signed)
   patient is a 43 year old who was seen the office for her annual exam on November 9 who had not been seen the office since 2012. Her history is as follows:  Patient is been complaining of worsening dysmenorrhea and menorrhagia.patient with past history of 2 cesarean sections and tubal ligation. Patient had an endometrial biopsy September 2012 with the following result because of having 2 periods per month at that time which demonstrated the following:  [place]. - NEGATIVE FOR HYPERPLASIA OR MALIGNANCY.  Patient had a sonohysterogram same year which demonstrated the following: The ultrasound demonstrated uterus that measured 10.6 x 6.2 x 6.0 cm endometrial stripe of 12.2 mm. 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 The sonohysterogram demonstrated an anterior left defect in the posterior left 9 x 6 mm suspicious for endometrial polyp and/or submucous myoma.  She underwent a resectoscopic polypectomy and benign endometrial polyps were removed with pathology report as follows: Endometrial polyp SECRETORY ENDOMETRIUM AND BENIGN ENDOMETRIAL POLYPS. NO HYPERPLASIA OR CARCINOMA.  Patient's recent lab work indicated that her hemoglobin was down to 9 g with hematocrit 29.4 and platelet count 4 and 59,000. Her conference metabolic panel, urinalysis and TSH was normal. Patient was started on iron tablet one twice a day.  Patient is here today for ultrasound. Report as follows: Uterus measured 13 x 9 x 7.8 cm within a major stripe 4.6 mm. Patient had 3 fibroids the largest one measuring 8.1 x 6.8 x 6.5 cm. The second fibroid measured 5.3 x 4.7 x 5.1 cm. The third fibroid measuring 1.5 x 0.8 x 1.4 cm. Ovaries appear to be normal. No fluid in the cul-de-sac.  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 Patient will return back within a  week to start her on Lupron 11.25 mg IM while she continue on her iron supplementation twice a day. We went to plan a total abdominal hysterectomy with bilateral salpingectomy in 3 months to allow her hemoglobin to return back to normal range. Literature information on the above was provided. We will then see a week before surgery and recheck her CBC done preop consultation and exam.

## 2014-08-09 ENCOUNTER — Telehealth: Payer: Self-pay

## 2014-08-09 NOTE — Telephone Encounter (Signed)
I called patient to let her know I had faxed request/order for Lupron to [facility] and they will check benefits and call her.  She will be the one to give them the okay to shipment so to be watching for them to call soon.  I will call her as soon as meds arrive here to arrange injection. I told her I will check on her ins benefits soon and call her with that info regarding surgery in 3 mos.

## 2014-08-26 ENCOUNTER — Telehealth: Payer: Self-pay

## 2014-08-26 NOTE — Telephone Encounter (Signed)
I attempted to contact patient as I received a fax from [facility] stating that they were unable to contact patient regarding her Lupron benefits and to get her permission to ship medication.  I tried to contact patient but all her phone numbers are no longer working.  Letter sent to call.

## 2014-08-26 NOTE — Telephone Encounter (Signed)
I mailed the letter certified.

## 2014-09-17 HISTORY — PX: ABDOMINAL HYSTERECTOMY: SHX81

## 2014-12-01 ENCOUNTER — Telehealth: Payer: Self-pay

## 2014-12-01 NOTE — Telephone Encounter (Signed)
Patient called stating she needs a letter stating what her estimated surgery prepayment will be so that she can take it to her church as they may assist her with this.  I explained to her that I can only quote her an estimate for her surgeon's fees and this does not include hospital, pathology or anesthesiology charges. She understood.    I also reminded her that Dr. Moshe Salisbury had recommended Lupron and scheduling surgery 3 mos later to allow hgb recovery. I told her he may want to see her again prior to scheduling or at least check labs.  I just wanted to remind her of this so that she did not expect surgery in a few weeks. She did recall all of that and is fine with any and all of that.   Letter provided. Copy is in Epic chart.

## 2015-03-01 ENCOUNTER — Ambulatory Visit (INDEPENDENT_AMBULATORY_CARE_PROVIDER_SITE_OTHER): Payer: 59 | Admitting: Gynecology

## 2015-03-01 ENCOUNTER — Encounter: Payer: Self-pay | Admitting: Gynecology

## 2015-03-01 VITALS — BP 124/80

## 2015-03-01 DIAGNOSIS — D500 Iron deficiency anemia secondary to blood loss (chronic): Secondary | ICD-10-CM | POA: Diagnosis not present

## 2015-03-01 DIAGNOSIS — L659 Nonscarring hair loss, unspecified: Secondary | ICD-10-CM

## 2015-03-01 DIAGNOSIS — D251 Intramural leiomyoma of uterus: Secondary | ICD-10-CM

## 2015-03-01 DIAGNOSIS — N920 Excessive and frequent menstruation with regular cycle: Secondary | ICD-10-CM | POA: Diagnosis not present

## 2015-03-01 LAB — CBC WITH DIFFERENTIAL/PLATELET
BASOS PCT: 1 % (ref 0–1)
Basophils Absolute: 0.1 10*3/uL (ref 0.0–0.1)
EOS ABS: 0.1 10*3/uL (ref 0.0–0.7)
Eosinophils Relative: 1 % (ref 0–5)
HCT: 27.8 % — ABNORMAL LOW (ref 36.0–46.0)
HEMOGLOBIN: 8.3 g/dL — AB (ref 12.0–15.0)
LYMPHS ABS: 2.4 10*3/uL (ref 0.7–4.0)
Lymphocytes Relative: 38 % (ref 12–46)
MCH: 21.4 pg — AB (ref 26.0–34.0)
MCHC: 29.9 g/dL — ABNORMAL LOW (ref 30.0–36.0)
MCV: 71.8 fL — AB (ref 78.0–100.0)
MONO ABS: 0.4 10*3/uL (ref 0.1–1.0)
MONOS PCT: 7 % (ref 3–12)
MPV: 9.1 fL (ref 8.6–12.4)
NEUTROS ABS: 3.4 10*3/uL (ref 1.7–7.7)
NEUTROS PCT: 53 % (ref 43–77)
Platelets: 322 10*3/uL (ref 150–400)
RBC: 3.87 MIL/uL (ref 3.87–5.11)
RDW: 17 % — AB (ref 11.5–15.5)
WBC: 6.4 10*3/uL (ref 4.0–10.5)

## 2015-03-01 NOTE — Patient Instructions (Addendum)
Uterine Fibroid A uterine fibroid is a growth (tumor) that occurs in your uterus. This type of tumor is not cancerous and does not spread out of the uterus. You can have one or many fibroids. Fibroids can vary in size, weight, and where they grow in the uterus. Some can become quite large. Most fibroids do not require medical treatment, but some can cause pain or heavy bleeding during and between periods. CAUSES  A fibroid is the result of a single uterine cell that keeps growing (unregulated), which is different than most cells in the human body. Most cells have a control mechanism that keeps them from reproducing without control.  SIGNS AND SYMPTOMS   Bleeding.  Pelvic pain and pressure.  Bladder problems due to the size of the fibroid.  Infertility and miscarriages depending on the size and location of the fibroid. DIAGNOSIS  Uterine fibroids are diagnosed through a physical exam. Your health care provider may feel the lumpy tumors during a pelvic exam. Ultrasonography may be done to get information regarding size, location, and number of tumors.  TREATMENT   Your health care provider may recommend watchful waiting. This involves getting the fibroid checked by your health care provider to see if it grows or shrinks.   Hormone treatment or an intrauterine device (IUD) may be prescribed.   Surgery may be needed to remove the fibroids (myomectomy) or the uterus (hysterectomy). This depends on your situation. When fibroids interfere with fertility and a woman wants to become pregnant, a health care provider may recommend having the fibroids removed.  [place] care depends on how you were treated. In general:   Keep all follow-up appointments with your health care provider.   Only take over-the-counter or prescription medicines as directed by your health care provider. If you were prescribed a hormone treatment, take the hormone medicines exactly as directed. Do not  take aspirin. It can cause bleeding.   Talk to your health care provider about taking iron pills.  If your periods are troublesome but not so heavy, lie down with your feet raised slightly above your heart. Place cold packs on your lower abdomen.   If your periods are heavy, write down the number of pads or tampons you use per month. Bring this information to your health care provider.   Include green vegetables in your diet.  SEEK IMMEDIATE MEDICAL CARE IF:  You have pelvic pain or cramps not controlled with medicines.   You have a sudden increase in pelvic pain.  You have an increase in bleeding between and during periods.  Abdominal Hysterectomy Abdominal hysterectomy is a surgical procedure to remove your womb (uterus). Your uterus is the muscular organ that contains a developing baby. This surgery is done for many reasons. You may need an abdominal hysterectomy if you have cancer, growths (tumors), long-term pain, or bleeding. You may also have this procedure if your uterus has slipped down into your vagina (uterine prolapse). Depending on why you need an abdominal hysterectomy, you may also have other reproductive organs removed. These could include the part of your vagina that connects with your uterus (cervix), the organs that make eggs (ovaries), and the tubes that connect the ovaries to the uterus (fallopian tubes). LET [facility] CARE PROVIDER KNOW ABOUT:  Any allergies you have. All medicines you are taking, including vitamins, herbs, eye drops, creams, and over-the-counter medicines. Previous problems you or members of your family have had with the use of anesthetics. Any blood  disorders you have. Previous surgeries you have had. Medical conditions you have. RISKS AND COMPLICATIONS Generally, this is a safe procedure. However, as with any procedure, problems can occur. Infection is the most common problem after an abdominal hysterectomy. Other possible problems  include: Bleeding. Formation of blood clots that may break free and travel to your lungs. Injury to other organs near your uterus. Nerve injury causing nerve pain. Decreased interest in sex or pain during sexual intercourse. BEFORE THE PROCEDURE Abdominal hysterectomy is a major surgical procedure. It can affect the way you feel about yourself. Talk to your health care provider about the physical and emotional changes hysterectomy may cause. You may need to have blood work and X-rays done before surgery. Quit smoking if you smoke. Ask your health care provider for help if you are struggling to quit. Stop taking medicines that thin your blood as directed by your health care provider. You may be instructed to take antibiotic medicines or laxatives before surgery. Do not eat or drink anything for 6-8 hours before surgery. Take your regular medicines with a small sip of water. Bathe or shower the night or morning before surgery. PROCEDURE Abdominal hysterectomy is done in the operating room at the hospital. In most cases, you will be given a medicine that makes you go to sleep (general anesthetic). The surgeon will make a cut (incision) through the skin in your lower belly. The incision may be about 5-7 inches long. It may go side-to-side or up-and-down. The surgeon will move aside the body tissue that covers your uterus. The surgeon will then carefully take out your uterus along with any of your other reproductive organs that need to be removed. Bleeding will be controlled with clamps or sutures. The surgeon will close your incision with sutures or metal clips. AFTER THE PROCEDURE You will have some pain immediately after the procedure. You will be given pain medicine in the recovery room. You will be taken to your hospital room when you have recovered from the anesthesia. You may need to stay in the hospital for 2-5 days. You will be given instructions for recovery at home. Document  Released: 09/08/2013 Document Reviewed: 09/08/2013 [facility] Patient Information 2015 [place], [place]. This information is not intended to replace advice given to you by your health care provider. Make sure you discuss any questions you have with your health care provider.    You have excessive periods and soak tampons or pads in a half hour or less.  You feel lightheaded or have fainting episodes. Document Released: 08/31/2000 Document Revised: 06/24/2013 Document Reviewed: 04/02/2013 [facility] Patient Information 2015 [place], [place]. This information is not intended to replace advice given to you by your health care provider. Make sure you discuss any questions you have with your health care provider.

## 2015-03-01 NOTE — Progress Notes (Signed)
   Patient is a 44 year old who presented to the office today complaining of worsening dysmenorrhea menorrhagia and pelvic pressure and she feels like her fibroid uterus has continued to increase in size. Patient has 2 prior cesarean sections and tubal ligation. Patient had an endometrial biopsy September 2012 with the following result because of having 2 periods per month at that time which demonstrated the following:  [place]. - NEGATIVE FOR HYPERPLASIA OR MALIGNANCY.  Patient had a sonohysterogram same year which demonstrated the following: The ultrasound demonstrated uterus that measured 10.6 x 6.2 x 6.0 cm endometrial stripe of 12.2 mm. 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 The sonohysterogram demonstrated an anterior left defect in the posterior left 9 x 6 mm suspicious for endometrial polyp and/or submucous myoma.  She underwent a resectoscopic polypectomy and benign endometrial polyps were removed with pathology report as follows: Endometrial polyp SECRETORY ENDOMETRIUM AND BENIGN ENDOMETRIAL POLYPS. NO HYPERPLASIA OR CARCINOMA.  Patient's last hemoglobin the office was 9.0 in November 2015. Showed a normal TSH at that time and her platelet count was normal. Her ultrasound on that office visit demonstrated the following:  Uterus measured 13 x 9 x 7.8 cm within a major stripe 4.6 mm. Patient had 3 fibroids the largest one measuring 8.1 x 6.8 x 6.5 cm. The second fibroid measured 5.3 x 4.7 x 5.1 cm. The third fibroid measuring 1.5 x 0.8 x 1.4 cm. Ovaries appear to be normal. No fluid in the cul-de-sac.  Patient was going to be started on Lupron 11.25 mg IM and then schedule her surgery such as a total abdominal hysterectomy with bilateral salpingectomy 3 months later if her CBC had improved. She did not follow through it is here today with the above mentioned symptoms.  Exam: Blood pressure  124/80 Gen. appearance well-developed on nourished female in no acute distress Abdomen: Soft nontender no rebound or guarding Pelvic: Bartholin urethra Skene was within normal limits Vagina: No lesions or discharge Cervix: No lesions or discharge Uterus: 14-16 week size irregular nontender mobile Adnexa: Difficult to assess due to size of uterus Rectal exam: Not done  Assessment/plan: Patient with enlarging leiomyomatous uteri contributing to dysmenorrhea menorrhagia and past history of anemia. She has continue to take her iron supplementation. She is going to have her CBC today as well as a TSH checked. She did complain some alopecia as well as craving for ice. She is going to return back to the office in 1-2 weeks for ultrasound. We are going to tentatively schedule her for a total abdominal hysterectomy with bilateral salpingectomy next month. We'll wait to see what her CBC is. She'll continue to take her R supplementation. Literature information was provided. Of note Pap smear last year was normal.

## 2015-03-02 ENCOUNTER — Telehealth: Payer: Self-pay

## 2015-03-02 LAB — TSH: TSH: 0.803 u[IU]/mL (ref 0.350–4.500)

## 2015-03-02 NOTE — Telephone Encounter (Signed)
I called patient, per Dr. Moshe Salisbury and let her know her iron is low 8.3. Per Dr. Moshe Salisbury to increase iron tablets to bid.  Also, Dr. Moshe Salisbury rec Lupron 11.25mg . I explained to patient I had already sent order request/Rx and that the company I send it to will check on ins benefits and which specialty pharmacy she can use. They will call her to review benefits and ultimately she will give them okay to ship meds if affordable for her and she wants them to.  I told her we will schedule surgery 10-12 weeks after injection. I will call her when Lupron arrives here so she can get injection and we will schedule her surgery at that point.

## 2015-03-24 ENCOUNTER — Telehealth: Payer: Self-pay

## 2015-03-24 ENCOUNTER — Telehealth: Payer: Self-pay | Admitting: Gynecology

## 2015-03-24 NOTE — Telephone Encounter (Signed)
This is a Dr. Toney Rakes patient

## 2015-03-24 NOTE — Telephone Encounter (Signed)
03/24/15-Per JF request I checked pt [employer] benefits for Mirena & insertion for med dx N92.0. Per the representative, it still would be covered at 100%/wl--I did not call the patient with this information per KA note.

## 2015-03-24 NOTE — Telephone Encounter (Signed)
I will need to see her in consultation to discuss options to bring her HgB up which will take about three months before surgery. Remind her to take her iron tablet twice a day. Check to see also if [employer] will cover for Circle IUD.

## 2015-03-24 NOTE — Telephone Encounter (Addendum)
Patient spoke with [employer] regarding benefits for Lupron.  It is going to cost her $3000 and she cannot afford. She told them not to ship it.  She wanted me to check with you regarding other options.  (You had sent me order for TAH but wanted her to have Lupron first because hgb 8.3 )

## 2015-03-24 NOTE — Telephone Encounter (Signed)
Jean English checked benefits for Mirena IUD and reports "I called her [employer] ins and told them this was for medical dx(N92.0) menorrhagia and they said it still would be covered at 100%. "  I will contact patient tomorrow and have her schedule office visit.

## 2015-03-24 NOTE — Telephone Encounter (Signed)
I had message from [place] that patient called and is wanting to move ahead with Lupron. I called her back and explained that everything has been done. The pharmacy has tried to contact her without success. They need her verbal consent to ship the Rx to [place]. I provided her with the phone number to call to do this.

## 2015-03-25 NOTE — Telephone Encounter (Signed)
Patient informed that Dr. Moshe Salisbury recommended consult to discuss options.  Appt made.

## 2015-04-01 ENCOUNTER — Other Ambulatory Visit: Payer: 59

## 2015-04-01 ENCOUNTER — Ambulatory Visit: Payer: 59 | Admitting: Gynecology

## 2015-04-06 ENCOUNTER — Other Ambulatory Visit: Payer: Self-pay | Admitting: Gynecology

## 2015-04-06 ENCOUNTER — Ambulatory Visit (INDEPENDENT_AMBULATORY_CARE_PROVIDER_SITE_OTHER): Payer: 59 | Admitting: Gynecology

## 2015-04-06 ENCOUNTER — Encounter: Payer: Self-pay | Admitting: Gynecology

## 2015-04-06 ENCOUNTER — Telehealth: Payer: Self-pay | Admitting: *Deleted

## 2015-04-06 ENCOUNTER — Ambulatory Visit (INDEPENDENT_AMBULATORY_CARE_PROVIDER_SITE_OTHER): Payer: 59

## 2015-04-06 VITALS — BP 126/80

## 2015-04-06 DIAGNOSIS — N939 Abnormal uterine and vaginal bleeding, unspecified: Secondary | ICD-10-CM

## 2015-04-06 DIAGNOSIS — N920 Excessive and frequent menstruation with regular cycle: Secondary | ICD-10-CM

## 2015-04-06 DIAGNOSIS — D251 Intramural leiomyoma of uterus: Secondary | ICD-10-CM

## 2015-04-06 DIAGNOSIS — N921 Excessive and frequent menstruation with irregular cycle: Secondary | ICD-10-CM | POA: Diagnosis not present

## 2015-04-06 DIAGNOSIS — D500 Iron deficiency anemia secondary to blood loss (chronic): Secondary | ICD-10-CM

## 2015-04-06 DIAGNOSIS — R102 Pelvic and perineal pain: Secondary | ICD-10-CM | POA: Diagnosis not present

## 2015-04-06 DIAGNOSIS — N852 Hypertrophy of uterus: Secondary | ICD-10-CM | POA: Diagnosis not present

## 2015-04-06 MED ORDER — MEGESTROL ACETATE 40 MG PO TABS: ORAL_TABLET | ORAL | Status: DC

## 2015-04-06 NOTE — Patient Instructions (Signed)
Megestrol oral suspension What is this medicine? MEGESTROL (me JES trol) suspension improves appetite and helps cause weight gain. It is used in conditions that cause significant loss of appetite and weight, such as AIDS. This medicine may be used for other purposes; ask your health care provider or pharmacist if you have questions. COMMON BRAND NAME(S): Megace, Megace ES What should I tell my health care provider before I take this medicine? They need to know if you have any of these conditions: -adrenal gland problems -history of blood clots of the legs, lungs, or other parts of the body -diabetes -kidney disease -liver disease -an unusual or allergic reaction to megestrol, other medicines, foods, dyes, or preservatives -pregnant or trying to get pregnant -breast-feeding How should I use this medicine? Take this medicine by mouth. Follow the directions on the prescription label. Shake well before using. Use a specially marked spoon or container to measure your medicine. Ask your pharmacist if you do not have one. Household spoons are not accurate. Take your medicine at regular intervals. Do not take your medicine more often than directed. Do not stop taking except on your doctor's advice. Contact your pediatrician or health care provider regarding the use of this medicine in children. Special care may be needed. Overdosage: If you think you have taken too much of this medicine contact a poison control center or emergency room at once. NOTE: This medicine is only for you. Do not share this medicine with others. What if I miss a dose? If you miss or forget a single dose, continue with your next regularly scheduled dose on the next day. Do not take double or extra doses. What may interact with this medicine? Do not take this medicine with any of the following medications: -dofetilide This medicine may also interact with the following  medications: -carbamazepine -indinavir -phenobarbital -phenytoin -primidone -rifampin -warfarin This list may not describe all possible interactions. Give your health care provider a list of all the medicines, herbs, non-prescription drugs, or dietary supplements you use. Also tell them if you smoke, drink alcohol, or use illegal drugs. Some items may interact with your medicine. What should I watch for while using this medicine? Visit your doctor or health care professional for regular checks on your progress. It may take up to 3 weeks to first notice an improved appetite. It may take up to 12 weeks to notice weight gain. If you are a female of child-bearing age, use an effective method of birth control while you are taking this medicine. This medicine should not be used by females who are pregnant or breast-feeding. There is a potential for serious side effects to an unborn child or to an infant. Talk to your health care professional or pharmacist for more information. This medicine may affect blood sugar levels. If you have diabetes, check with your doctor or health care professional before you change your diet or the dose of your diabetic medicine. What side effects may I notice from receiving this medicine? Side effects that you should report to your doctor or health care professional as soon as possible: -allergic reactions like skin rash, itching or hives, swelling of the face, lips, or tongue -difficulty breathing or shortness of breath -chest pain -dizziness -fluid retention -increased blood pressure -leg pain or swelling -nausea, vomiting -weakness Side effects that usually do not require medical attention (report to your doctor or health care professional if they continue or are bothersome): -breakthrough menstrual bleeding -diarrhea -gas -hot flashes or flushing -sexual   difficulties in men This list may not describe all possible side effects. Call your doctor for medical  advice about side effects. You may report side effects to FDA at 1-800-FDA-1088. Where should I keep my medicine? Keep out of the reach of children. Store at room temperature between 15 and 25 degrees C (59 and 77 degrees F). Keep tightly closed. Protect from heat. Throw away any unused medicine after the expiration date. NOTE: This sheet is a summary. It may not cover all possible information. If you have questions about this medicine, talk to your doctor, pharmacist, or health care provider.  2015, Elsevier/Gold Standard. (2008-03-22 15:58:31)

## 2015-04-06 NOTE — Progress Notes (Addendum)
   Patient presented to the office today for follow-up ultrasound on her leiomyomatous uteri which has contributed to her anemia and pelvic pain along with dysmenorrhea and menorrhagia. Her history as follows:  Patient has 2 prior cesarean sections and tubal ligation. Patient had an endometrial biopsy September 2012 with the following result because of having 2 periods per month at that time which demonstrated the following:  [place]. - NEGATIVE FOR HYPERPLASIA OR MALIGNANCY.  Patient had a sonohysterogram same year which demonstrated the following: The ultrasound demonstrated uterus that measured 10.6 x 6.2 x 6.0 cm endometrial stripe of 12.2 mm. 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 The sonohysterogram demonstrated an anterior left defect in the posterior left 9 x 6 mm suspicious for endometrial polyp and/or submucous myoma.  She underwent a resectoscopic polypectomy and benign endometrial polyps were removed with pathology report as follows: Endometrial polyp SECRETORY ENDOMETRIUM AND BENIGN ENDOMETRIAL POLYPS. NO HYPERPLASIA OR CARCINOMA.  Patient's last hemoglobin the office was 9.0 in November 2015. Showed a normal TSH at that time and her platelet count was normal. Her ultrasound on that office visit demonstrated the following:  Uterus measured 13 x 9 x 7.8 cm within a major stripe 4.6 mm. Patient had 3 fibroids the largest one measuring 8.1 x 6.8 x 6.5 cm. The second fibroid measured 5.3 x 4.7 x 5.1 cm. The third fibroid measuring 1.5 x 0.8 x 1.4 cm. Ovaries appear to be normal. No fluid in the cul-de-sac.  Patient was going to be started on Lupron 11.25 mg IM and then schedule her surgery such as a total abdominal hysterectomy with bilateral salpingectomy 3 months later if her CBC had improved. She did not follow through and was seen in the office on June 14 of this year all set to proceed with  recommendation. On exam her uterus measured proximally 1618 weeks size. And her most recent CBC on June 14 demonstrated hemoglobin of 8.3 and when compare with November 9 and had been 9.0. She is currently taking iron tablets twice a day.  Ultrasound today: Uterus measured 19.2 x 11.6 x 9.2 cm with endometrial stripe is 7.3 mm. Several intramural myomas a larger measured 8.4 x 7.2 x 9.0 cm displacing the endometrial cavity. The cervix a 6.6 x 7.0 x 5.5 cm fibroid as well as a 3.5 x 3.17 m fibroid. Both ovaries were normal. No fluid in the cul-de-sac. Fluid was seen in the lower endometrial cavity.  Assessment/plan: Patient with 20 week size fibroid uterus contributing to her anemia dysmenorrhea and menorrhagia. Patient is currently on iron supplementation 1 tablet twice a day. Her insurance company would not cover for the Lupron 11.25 mg shot to be administered for 3 months before proceeding with surgery once her hemoglobin had return back to normal. I'm going to talk to her insurance carrier for approval. Meanwhile she will be prescribed Megace 40 mg one by mouth twice a day for 10 days of each month which she expresses the heavy bleeding. Another alternative would be to place a Mirena IUD not only for contraception but for cycle control in an effort to give [place] enough time for her iron levels to return back to normal before scheduling her recommended total abdominal hysterectomy with bilateral salpingectomy. This was related to the patient. CBC will be drawn today and repeat in one month.

## 2015-04-06 NOTE — Telephone Encounter (Signed)
I spoke with Dr Moshe Salisbury regarding patients insurance plan and coverage of Lupron. She has a deductible that the Lupron falls under. Dr Sandrea Hughs next option is the Mirena for contraception and menorrhagia. Covered 100 % by insurance.  Will schedule and do CBC at same visit and reevaluate 3 months later. Pt informed and understood. Apt made. KW  Dr Moshe Salisbury advised to do insertion under US guidance will call pt to re-schedule. KW CMA

## 2015-04-07 ENCOUNTER — Other Ambulatory Visit: Payer: Self-pay | Admitting: Gynecology

## 2015-04-07 DIAGNOSIS — Z30430 Encounter for insertion of intrauterine contraceptive device: Secondary | ICD-10-CM

## 2015-04-07 DIAGNOSIS — N939 Abnormal uterine and vaginal bleeding, unspecified: Secondary | ICD-10-CM

## 2015-04-07 DIAGNOSIS — D251 Intramural leiomyoma of uterus: Secondary | ICD-10-CM

## 2015-04-07 DIAGNOSIS — N852 Hypertrophy of uterus: Secondary | ICD-10-CM

## 2015-04-12 ENCOUNTER — Ambulatory Visit (INDEPENDENT_AMBULATORY_CARE_PROVIDER_SITE_OTHER): Payer: 59 | Admitting: Gynecology

## 2015-04-12 ENCOUNTER — Ambulatory Visit: Payer: 59 | Admitting: Gynecology

## 2015-04-12 ENCOUNTER — Encounter: Payer: Self-pay | Admitting: Gynecology

## 2015-04-12 ENCOUNTER — Telehealth: Payer: Self-pay | Admitting: Gynecology

## 2015-04-12 ENCOUNTER — Ambulatory Visit (INDEPENDENT_AMBULATORY_CARE_PROVIDER_SITE_OTHER): Payer: 59

## 2015-04-12 DIAGNOSIS — D251 Intramural leiomyoma of uterus: Secondary | ICD-10-CM

## 2015-04-12 DIAGNOSIS — N939 Abnormal uterine and vaginal bleeding, unspecified: Secondary | ICD-10-CM | POA: Diagnosis not present

## 2015-04-12 DIAGNOSIS — N852 Hypertrophy of uterus: Secondary | ICD-10-CM | POA: Diagnosis not present

## 2015-04-12 DIAGNOSIS — Z30430 Encounter for insertion of intrauterine contraceptive device: Secondary | ICD-10-CM

## 2015-04-12 DIAGNOSIS — Z975 Presence of (intrauterine) contraceptive device: Secondary | ICD-10-CM | POA: Insufficient documentation

## 2015-04-12 NOTE — Progress Notes (Addendum)
   Patient presented to the office today for placement of [place] IUD which has lead bone or just roll to help cut down on her menorrhagia. Patient has a leiomyomatous uteri contributing to her anemia. Her insurance company would not cover the expense for the Lupron. She is currently taking her iron tablet once twice a day. She originally was prescribed Megace 40 mg twice a day for 10 days to stop her bleeding. We are trying to buy some time to get her hemoglobin back to a normal range Before proceeding with a total abdominal hysterectomy with bilateral salpingectomy. Patient's last hemoglobin was 8.3 on 03/01/2015.  Ultrasound: Ultrasound today: Uterus measured 19.2 x 11.6 x 9.2 cm with endometrial stripe is 7.3 mm. Several intramural myomas a larger measured 8.4 x 7.2 x 9.0 cm displacing the endometrial cavity. The cervix a 6.6 x 7.0 x 5.5 cm fibroid as well as a 3.5 x 3.17 m fibroid. Both ovaries were normal. No fluid in the cul-de-sac. Fluid was seen in the lower endometrial cavity.  IUD placement was done under sonographic guidance                                                                    IUD procedure note       Patient presented to the office today for placement of [place] IUD. The patient had previously been provided with literature information on this method of contraception. The risks benefits and pros and cons were discussed and all her questions were answered. She is fully aware that this form of contraception is 99% effective and is good for 3 years.  Pelvic exam: Bartholin urethra Skene glands: Within normal limits Vagina: No lesions or discharge Cervix: No lesions or discharge Uterus: anteverted position Adnexa: No masses or tenderness Rectal exam: Not done  The cervix was cleansed with Betadine solution. A single-tooth tenaculum was placed on the anterior cervical lip. The uterus sounded to  9 1/2 centimeter. The IUD was shown to the patient and inserted in a sterile  fashion. The IUD string was trimmed. The single-tooth tenaculum was removed. Patient was instructed to return back to the office in one month for follow up.   The IUD was confirmed in the proper position as visualized by ultrasound today.      Liletta three-year IUD placed 04/12/2015 Lot #[id number]

## 2015-04-12 NOTE — Patient Instructions (Signed)

## 2015-04-12 NOTE — Telephone Encounter (Signed)
04/12/15-I spoke w/pt last night after calling her [facility] insurance again to check on benefit for ultrasound guidance with the Mirena insertion and was informed that the information I had previously been told about the MIrena being covered at 100% even for medical dx was incorrect. It will apply to the patient's $3200 deductible of which only $150.14 is met. She would be responsible for the [facility] allowable cost of the Mirena which is $1490.66 plus the ultrasound and office visit charges. Patient is unable to pay upfront the cost of the Mirena which is standard protocol. Gretta Arab was going to talk with JF and get back to patient in regards to this ref to setting up payment plan.wl

## 2015-04-13 ENCOUNTER — Encounter: Payer: Self-pay | Admitting: Gynecology

## 2015-05-13 ENCOUNTER — Encounter: Payer: Self-pay | Admitting: Gynecology

## 2015-05-13 ENCOUNTER — Ambulatory Visit (INDEPENDENT_AMBULATORY_CARE_PROVIDER_SITE_OTHER): Payer: 59 | Admitting: Gynecology

## 2015-05-13 VITALS — BP 126/82

## 2015-05-13 DIAGNOSIS — D251 Intramural leiomyoma of uterus: Secondary | ICD-10-CM | POA: Diagnosis not present

## 2015-05-13 DIAGNOSIS — Z30431 Encounter for routine checking of intrauterine contraceptive device: Secondary | ICD-10-CM

## 2015-05-13 DIAGNOSIS — D500 Iron deficiency anemia secondary to blood loss (chronic): Secondary | ICD-10-CM | POA: Diagnosis not present

## 2015-05-13 LAB — CBC WITH DIFFERENTIAL/PLATELET
Basophils Absolute: 0.1 10*3/uL (ref 0.0–0.1)
Basophils Relative: 2 % — ABNORMAL HIGH (ref 0–1)
Eosinophils Absolute: 0.1 10*3/uL (ref 0.0–0.7)
Eosinophils Relative: 1 % (ref 0–5)
HEMATOCRIT: 30.6 % — AB (ref 36.0–46.0)
HEMOGLOBIN: 9.4 g/dL — AB (ref 12.0–15.0)
LYMPHS ABS: 2.4 10*3/uL (ref 0.7–4.0)
LYMPHS PCT: 41 % (ref 12–46)
MCH: 23.5 pg — ABNORMAL LOW (ref 26.0–34.0)
MCHC: 30.7 g/dL (ref 30.0–36.0)
MCV: 76.5 fL — AB (ref 78.0–100.0)
MPV: 9 fL (ref 8.6–12.4)
Monocytes Absolute: 0.5 10*3/uL (ref 0.1–1.0)
Monocytes Relative: 9 % (ref 3–12)
NEUTROS ABS: 2.7 10*3/uL (ref 1.7–7.7)
NEUTROS PCT: 47 % (ref 43–77)
Platelets: 335 10*3/uL (ref 150–400)
RBC: 4 MIL/uL (ref 3.87–5.11)
RDW: 18.5 % — ABNORMAL HIGH (ref 11.5–15.5)
WBC: 5.8 10*3/uL (ref 4.0–10.5)

## 2015-05-13 NOTE — Addendum Note (Signed)
Addended by: Terrance Mass on: 05/13/2015 02:52 PM   Modules accepted: Orders

## 2015-05-13 NOTE — Progress Notes (Signed)
   Patient presented to the office for her 1 month follow-up after having placed the [place] IUD in an effort to control her menorrhagia which was contributing to anemia. Patient with history of leiomyomatous uteri. We will going to place her on Lupron in an effort to cut down on her bleeding and get her hemoglobin up before planned hysterectomy but her insurance company would not cover the expense. The IUD was placed in an effort to buy some time to get her hemoglobin to a safe level to proceed with definitive surgery. Patient's last hemoglobin was 8.3 on 03/01/2015 and she's currently taking one iron tablet twice a day. Her last ultrasound was done at the time of placement of the IUD on July 26 and the following was noted:  Uterus measured 19.2 x 11.6 x 9.2 cm with endometrial stripe is 7.3 mm. Several intramural myomas a larger measured 8.4 x 7.2 x 9.0 cm displacing the endometrial cavity. The cervix a 6.6 x 7.0 x 5.5 cm fibroid as well as a 3.5 x 3.17 m fibroid. Both ovaries were normal. No fluid in the cul-de-sac. Fluid was seen in the lower endometrial cavity.  The IUD was placed under ultrasound guidance and it was confirmed that it was in the uterine cavity. Her uterus did sound to 9 cm.  Patient is complaining of spotting since the placement of the IUD. But no cramping reported.  Exam: Blood pressure 126/82 Gen. appearance well-developed well-nourished female with above-mentioned complaint Abdomen: Soft nontender fundal height 2 fingerbreadths underneath the umbilicus nontender Pelvic: Bartholin urethra Skene was within normal limits Vagina small amount of blood was noted in the vaginal vault Cervix IUD string was seen along and it was trimmed Bimanual exam uterus approximately 18-20 weeks size nontender Adnexa difficult to assess as a result of her enlarged fibroid uterus Rectal exam not done  Assessment/plan: 44 year old patient with symptomatic leiomyomatous uteri contributing to  deficiency anemia M pelvic pressure and irregular bleeding. Insurance company would not cover for Lupron for 3 months in an effort to get her hemoglobin up before planning definitive surgical intervention as in a hysterectomy. For this reason patient was placed on L[place]ta IUD and continue to take her iron tablet twice a day. We are going to check her CBC today. She will return back to the office next week for an ultrasound to make sure the IUD has not shifted position as a result of her fibroids.

## 2015-05-25 ENCOUNTER — Other Ambulatory Visit: Payer: Self-pay | Admitting: Gynecology

## 2015-05-25 ENCOUNTER — Encounter: Payer: Self-pay | Admitting: Gynecology

## 2015-05-25 ENCOUNTER — Ambulatory Visit (INDEPENDENT_AMBULATORY_CARE_PROVIDER_SITE_OTHER): Payer: 59 | Admitting: Gynecology

## 2015-05-25 ENCOUNTER — Ambulatory Visit (INDEPENDENT_AMBULATORY_CARE_PROVIDER_SITE_OTHER): Payer: 59

## 2015-05-25 VITALS — BP 130/80

## 2015-05-25 DIAGNOSIS — D500 Iron deficiency anemia secondary to blood loss (chronic): Secondary | ICD-10-CM | POA: Diagnosis not present

## 2015-05-25 DIAGNOSIS — Z30431 Encounter for routine checking of intrauterine contraceptive device: Secondary | ICD-10-CM

## 2015-05-25 DIAGNOSIS — D251 Intramural leiomyoma of uterus: Secondary | ICD-10-CM | POA: Diagnosis not present

## 2015-05-25 DIAGNOSIS — N920 Excessive and frequent menstruation with regular cycle: Secondary | ICD-10-CM | POA: Diagnosis not present

## 2015-05-25 NOTE — Progress Notes (Signed)
   44 year old patient with symptomatic leiomyomatous uteri confirmed to menorrhagia and anemia is here for follow-up to confirm position of Liletta IUD that had been placed for control. Her history as follows:  Patient with history of leiomyomatous uteri. We will going to place her on Lupron in an effort to cut down on her bleeding and get her hemoglobin up before planned hysterectomy but her insurance company would not cover the expense. The IUD was placed in an effort to buy some time to get her hemoglobin to a safe level to proceed with definitive surgery. Patient's last hemoglobin was 8.3 on 03/01/2015 and she's currently taking one iron tablet twice a day. Her last ultrasound was done at the time of placement of the IUD on July 26 and the following was noted:  Uterus measured 19.2 x 11.6 x 9.2 cm with endometrial stripe is 7.3 mm. Several intramural myomas a larger measured 8.4 x 7.2 x 9.0 cm displacing the endometrial cavity. The cervix a 6.6 x 7.0 x 5.5 cm fibroid as well as a 3.5 x 3.17 m fibroid. Both ovaries were normal. No fluid in the cul-de-sac. Fluid was seen in the lower endometrial cavity.  She reports occasional spotting and her last menstrual cycle lasted 5-6 days.  Ultrasound today: IUD was seen in the endometrial cavity. It was located in the normal position between fundal fibroids. She had 2 fundal fibroids one measured 8.6 x 20 x 7.9 cm and a posterior fibroid measured 6.9 x 7.3 x 6.6 cm. Right ovary was normal. Left ovary corpus luteum cyst measuring 13 x 18 mm was noted negative color flow  Assessment/plan: Patient was symptomatic leiomyomatous uteri hemoglobin has improved from 8.3-9.4. Patient will return back to the office in 2 months for follow-up CBC once we get the hemoglobin up to a normal range we'll plan on scheduling a total abdominal hysterectomy with lateral salpingectomy and ovarian conservation. Meanwhile patient will continue to take her iron tablets twice a  day.

## 2015-07-05 ENCOUNTER — Other Ambulatory Visit: Payer: Self-pay | Admitting: Gynecology

## 2015-07-05 DIAGNOSIS — D649 Anemia, unspecified: Secondary | ICD-10-CM

## 2015-08-03 ENCOUNTER — Other Ambulatory Visit: Payer: 59

## 2015-08-03 DIAGNOSIS — D649 Anemia, unspecified: Secondary | ICD-10-CM

## 2015-08-03 LAB — CBC WITH DIFFERENTIAL/PLATELET
Basophils Absolute: 0 10*3/uL (ref 0.0–0.1)
Basophils Relative: 1 % (ref 0–1)
EOS PCT: 1 % (ref 0–5)
Eosinophils Absolute: 0 10*3/uL (ref 0.0–0.7)
HCT: 30.5 % — ABNORMAL LOW (ref 36.0–46.0)
Hemoglobin: 9.3 g/dL — ABNORMAL LOW (ref 12.0–15.0)
LYMPHS ABS: 2.1 10*3/uL (ref 0.7–4.0)
Lymphocytes Relative: 42 % (ref 12–46)
MCH: 22.6 pg — ABNORMAL LOW (ref 26.0–34.0)
MCHC: 30.5 g/dL (ref 30.0–36.0)
MCV: 74.2 fL — AB (ref 78.0–100.0)
MPV: 9.5 fL (ref 8.6–12.4)
Monocytes Absolute: 0.4 10*3/uL (ref 0.1–1.0)
Monocytes Relative: 9 % (ref 3–12)
Neutro Abs: 2.3 10*3/uL (ref 1.7–7.7)
Neutrophils Relative %: 47 % (ref 43–77)
Platelets: 488 10*3/uL — ABNORMAL HIGH (ref 150–400)
RBC: 4.11 MIL/uL (ref 3.87–5.11)
RDW: 17.5 % — ABNORMAL HIGH (ref 11.5–15.5)
WBC: 4.9 10*3/uL (ref 4.0–10.5)

## 2015-08-04 ENCOUNTER — Encounter: Payer: Self-pay | Admitting: Gynecology

## 2015-08-04 ENCOUNTER — Other Ambulatory Visit: Payer: Self-pay | Admitting: Gynecology

## 2015-08-04 DIAGNOSIS — D500 Iron deficiency anemia secondary to blood loss (chronic): Secondary | ICD-10-CM

## 2015-08-05 ENCOUNTER — Encounter: Payer: Self-pay | Admitting: Gynecology

## 2015-08-05 ENCOUNTER — Ambulatory Visit (INDEPENDENT_AMBULATORY_CARE_PROVIDER_SITE_OTHER): Payer: 59 | Admitting: Gynecology

## 2015-08-05 VITALS — BP 124/78

## 2015-08-05 DIAGNOSIS — Z113 Encounter for screening for infections with a predominantly sexual mode of transmission: Secondary | ICD-10-CM | POA: Diagnosis not present

## 2015-08-05 DIAGNOSIS — N898 Other specified noninflammatory disorders of vagina: Secondary | ICD-10-CM

## 2015-08-05 DIAGNOSIS — A599 Trichomoniasis, unspecified: Secondary | ICD-10-CM | POA: Diagnosis not present

## 2015-08-05 LAB — WET PREP FOR TRICH, YEAST, CLUE: Yeast Wet Prep HPF POC: NONE SEEN

## 2015-08-05 MED ORDER — METRONIDAZOLE 500 MG PO TABS: 500.0000 mg | ORAL_TABLET | Freq: Two times a day (BID) | ORAL | Status: DC

## 2015-08-05 NOTE — Patient Instructions (Signed)
Metronidazole tablets or capsules What is this medicine? METRONIDAZOLE (me troe NI da zole) is an antiinfective. It is used to treat certain kinds of bacterial and protozoal infections. It will not work for colds, flu, or other viral infections. This medicine may be used for other purposes; ask your health care provider or pharmacist if you have questions. What should I tell my health care provider before I take this medicine? They need to know if you have any of these conditions: -anemia or other blood disorders -disease of the nervous system -fungal or yeast infection -if you drink alcohol containing drinks -liver disease -seizures -an unusual or allergic reaction to metronidazole, or other medicines, foods, dyes, or preservatives -pregnant or trying to get pregnant -breast-feeding How should I use this medicine? Take this medicine by mouth with a full glass of water. Follow the directions on the prescription label. Take your medicine at regular intervals. Do not take your medicine more often than directed. Take all of your medicine as directed even if you think you are better. Do not skip doses or stop your medicine early. Talk to your pediatrician regarding the use of this medicine in children. Special care may be needed. Overdosage: If you think you have taken too much of this medicine contact a poison control center or emergency room at once. NOTE: This medicine is only for you. Do not share this medicine with others. What if I miss a dose? If you miss a dose, take it as soon as you can. If it is almost time for your next dose, take only that dose. Do not take double or extra doses. What may interact with this medicine? Do not take this medicine with any of the following medications: -alcohol or any product that contains alcohol -amprenavir oral solution -cisapride -disulfiram -dofetilide -dronedarone -paclitaxel injection -pimozide -ritonavir oral solution -sertraline oral  solution -sulfamethoxazole-trimethoprim injection -thioridazine -ziprasidone This medicine may also interact with the following medications: -birth control pills -cimetidine -lithium -other medicines that prolong the QT interval (cause an abnormal heart rhythm) -phenobarbital -phenytoin -warfarin This list may not describe all possible interactions. Give your health care provider a list of all the medicines, herbs, non-prescription drugs, or dietary supplements you use. Also tell them if you smoke, drink alcohol, or use illegal drugs. Some items may interact with your medicine. What should I watch for while using this medicine? Tell your doctor or health care professional if your symptoms do not improve or if they get worse. You may get drowsy or dizzy. Do not drive, use machinery, or do anything that needs mental alertness until you know how this medicine affects you. Do not stand or sit up quickly, especially if you are an older patient. This reduces the risk of dizzy or fainting spells. Avoid alcoholic drinks while you are taking this medicine and for three days afterward. Alcohol may make you feel dizzy, sick, or flushed. If you are being treated for a sexually transmitted disease, avoid sexual contact until you have finished your treatment. Your sexual partner may also need treatment. What side effects may I notice from receiving this medicine? Side effects that you should report to your doctor or health care professional as soon as possible: -allergic reactions like skin rash or hives, swelling of the face, lips, or tongue -confusion, clumsiness -difficulty speaking -discolored or sore mouth -dizziness -fever, infection -numbness, tingling, pain or weakness in the hands or feet -trouble passing urine or change in the amount of urine -redness, blistering, peeling  or loosening of the skin, including inside the mouth -seizures -unusually weak or tired -vaginal irritation, dryness,  or discharge Side effects that usually do not require medical attention (report to your doctor or health care professional if they continue or are bothersome): -diarrhea -headache -irritability -metallic taste -nausea -stomach pain or cramps -trouble sleeping This list may not describe all possible side effects. Call your doctor for medical advice about side effects. You may report side effects to FDA at 1-800-FDA-1088. Where should I keep my medicine? Keep out of the reach of children. Store at room temperature below 25 degrees C (77 degrees F). Protect from light. Keep container tightly closed. Throw away any unused medicine after the expiration date. NOTE: This sheet is a summary. It may not cover all possible information. If you have questions about this medicine, talk to your doctor, pharmacist, or health care provider.    2016, Elsevier/Gold Standard. (2013-04-10 14:08:39) Trichomoniasis Trichomoniasis is an infection caused by an organism called Trichomonas. The infection can affect both women and men. In women, the outer female genitalia and the vagina are affected. In men, the penis is mainly affected, but the prostate and other reproductive organs can also be involved. Trichomoniasis is a sexually transmitted infection (STI) and is most often passed to another person through sexual contact.  RISK FACTORS  Having unprotected sexual intercourse.  Having sexual intercourse with an infected partner. SIGNS AND SYMPTOMS  Symptoms of trichomoniasis in women include:  Abnormal gray-green frothy vaginal discharge.  Itching and irritation of the vagina.  Itching and irritation of the area outside the vagina. Symptoms of trichomoniasis in men include:   Penile discharge with or without pain.  Pain during urination. This results from inflammation of the urethra. DIAGNOSIS  Trichomoniasis may be found during a Pap test or physical exam. Your health care provider may use one of the  following methods to help diagnose this infection:  Testing the pH of the vagina with a test tape.  Using a vaginal swab test that checks for the Trichomonas organism. A test is available that provides results within a few minutes.  Examining a urine sample.  Testing vaginal secretions. Your health care provider may test you for other STIs, including HIV. TREATMENT   You may be given medicine to fight the infection. Women should inform their health care provider if they could be or are pregnant. Some medicines used to treat the infection should not be taken during pregnancy.  Your health care provider may recommend over-the-counter medicines or creams to decrease itching or irritation.  Your sexual partner will need to be treated if infected.  Your health care provider may test you for infection again 3 months after treatment. HOME CARE INSTRUCTIONS   Take medicines only as directed by your health care provider.  Take over-the-counter medicine for itching or irritation as directed by your health care provider.  Do not have sexual intercourse while you have the infection.  Women should not douche or wear tampons while they have the infection.  Discuss your infection with your partner. Your partner may have gotten the infection from you, or you may have gotten it from your partner.  Have your sex partner get examined and treated if necessary.  Practice safe, informed, and protected sex.  See your health care provider for other STI testing. SEEK MEDICAL CARE IF:   You still have symptoms after you finish your medicine.  You develop abdominal pain.  You have pain when you urinate.  You have bleeding after sexual intercourse.  You develop a rash.  Your medicine makes you sick or makes you throw up (vomit). MAKE SURE YOU:  Understand these instructions.  Will watch your condition.  Will get help right away if you are not doing well or get worse.   This information  is not intended to replace advice given to you by your health care provider. Make sure you discuss any questions you have with your health care provider.   Document Released: 02/27/2001 Document Revised: 09/24/2014 Document Reviewed: 06/15/2013 Elsevier Interactive Patient Education [employer].

## 2015-08-05 NOTE — Progress Notes (Signed)
   Patient is a 44 year old with known history of large fibroid uterus who is currently on iron supplementation secondary to her anemia. Presented to the office today complaining of watery discharge with fishy odor for the past several weeks. Her history is as follows:  Patient with history of leiomyomatous uteri. We will going to place her on Lupron in an effort to cut down on her bleeding and get her hemoglobin up before planned hysterectomy but her insurance company would not cover the expense. The IUD was placed in an effort to buy some time to get her hemoglobin to a safe level to proceed with definitive surgery. Patient's last hemoglobin was 8.3 on 03/01/2015 and she's currently taking one iron tablet twice a day. Her last ultrasound was done at the time of placement of the IUD on July 26 and the following was noted:  Uterus measured 19.2 x 11.6 x 9.2 cm with endometrial stripe is 7.3 mm. Several intramural myomas a larger measured 8.4 x 7.2 x 9.0 cm displacing the endometrial cavity. The cervix a 6.6 x 7.0 x 5.5 cm fibroid as well as a 3.5 x 3.17 m fibroid. Both ovaries were normal. No fluid in the cul-de-sac. Fluid was seen in the lower endometrial cavity.  Since the placement of her progesterone containing oral contraceptive pill her hemoglobin which was 8.3 back in June 14 had increasing 9.4 on August 26 and on November 16 unchanged at 9.3 g.  Exam: Abdomen: Fundus of the uterus to the level of the umbilicus nontender Pelvic: Bartholin urethra Skene was within normal limits Vagina: Clear fishy discharge Cervix: No lesions or discharge IUD string seen Uterus 20 weeks size nontender Rectal exam: Not done  Wet prep: Moderate Trichomonas, many clue cells, few WBC, too numerous to count bacteria  Assessment/plan: #1 leiomyomatous uteri #2 iron deficiency anemia patient instructed to increase her iron from once a day up to twice a day for further company week and then to try to go a size 3  iron tablets a day. We'll repeat her CBC in 2 months with an ultrasound and if we can get her hemoglobin at least  up to 11 g we will plan on doing her hysterectomy if not I have informed her that she may need to be admitted to the hospital the day prior in an effort to transfuse her and then proceed with her abdominal hysterectomy with bilateral salpingectomy. #3 clinical evidence of trichomoniasis will be treated with Flagyl 500 mg one by mouth twice a day for 7 days and her partner. A GC and Chlamydia culture was obtained today. To complete the STD screen and HIV, RPR, hepatitis B and C Will BE obtained today as well.

## 2015-08-06 LAB — RPR

## 2015-08-06 LAB — HEPATITIS B SURFACE ANTIGEN: Hepatitis B Surface Ag: NEGATIVE

## 2015-08-06 LAB — HIV ANTIBODY (ROUTINE TESTING W REFLEX): HIV 1&2 Ab, 4th Generation: NONREACTIVE

## 2015-08-06 LAB — HEPATITIS C ANTIBODY: HCV Ab: NEGATIVE

## 2015-08-06 LAB — GC/CHLAMYDIA PROBE AMP
CT PROBE, AMP APTIMA: NEGATIVE
GC PROBE AMP APTIMA: NEGATIVE

## 2015-12-13 ENCOUNTER — Other Ambulatory Visit: Payer: 59

## 2015-12-13 DIAGNOSIS — D500 Iron deficiency anemia secondary to blood loss (chronic): Secondary | ICD-10-CM

## 2015-12-13 LAB — CBC WITH DIFFERENTIAL/PLATELET
BASOS ABS: 0.1 10*3/uL (ref 0.0–0.1)
Basophils Relative: 1 % (ref 0–1)
EOS ABS: 0 10*3/uL (ref 0.0–0.7)
EOS PCT: 0 % (ref 0–5)
HCT: 29.8 % — ABNORMAL LOW (ref 36.0–46.0)
Hemoglobin: 9.2 g/dL — ABNORMAL LOW (ref 12.0–15.0)
Lymphocytes Relative: 39 % (ref 12–46)
Lymphs Abs: 2.2 10*3/uL (ref 0.7–4.0)
MCH: 23.8 pg — ABNORMAL LOW (ref 26.0–34.0)
MCHC: 30.9 g/dL (ref 30.0–36.0)
MCV: 77.2 fL — ABNORMAL LOW (ref 78.0–100.0)
MONO ABS: 0.5 10*3/uL (ref 0.1–1.0)
MPV: 9.4 fL (ref 8.6–12.4)
Monocytes Relative: 8 % (ref 3–12)
Neutro Abs: 3 10*3/uL (ref 1.7–7.7)
Neutrophils Relative %: 52 % (ref 43–77)
PLATELETS: 403 10*3/uL — AB (ref 150–400)
RBC: 3.86 MIL/uL — AB (ref 3.87–5.11)
RDW: 17.3 % — AB (ref 11.5–15.5)
WBC: 5.7 10*3/uL (ref 4.0–10.5)

## 2015-12-15 ENCOUNTER — Other Ambulatory Visit: Payer: Self-pay | Admitting: Gynecology

## 2015-12-15 ENCOUNTER — Telehealth: Payer: Self-pay | Admitting: *Deleted

## 2015-12-15 ENCOUNTER — Telehealth: Payer: Self-pay

## 2015-12-15 DIAGNOSIS — D500 Iron deficiency anemia secondary to blood loss (chronic): Secondary | ICD-10-CM

## 2015-12-15 MED ORDER — METRONIDAZOLE 500 MG PO TABS: 500.0000 mg | ORAL_TABLET | Freq: Two times a day (BID) | ORAL | Status: DC

## 2015-12-15 NOTE — Telephone Encounter (Signed)
Patient informed. Rx sent 

## 2015-12-15 NOTE — Telephone Encounter (Signed)
Referral placed they will contact pt to schedule, once I will relay to [facility] so she can proceed with the surgery patient of staff message.

## 2015-12-15 NOTE — Telephone Encounter (Signed)
Per [facility] staff message " CBC and HgB essentially unchanged,would like for you to make an appointment for her with Heme Onc for consideration of IV Iron and then to scheduled u/s and preop with hysterectomy the same week. If you will coordinate the consult and give me the name of the heme/onc guy I wil send him a message and get back to you how soon after IV Infusion can we schedule her hysterectomy."

## 2015-12-15 NOTE — Telephone Encounter (Signed)
I called patient regarding recent labs and Dr. Moshe Salisbury recommendation that she consult with Hematologist regarding her chronic anemia. Anderson Malta will handle referral and I will let you know physician's name once scheduled so your can check with him regarding surgery after infusion.  Patient wanted me to check with you and see if you would prescribed Metronidazole for her again. You prescribed in in November and she said at that time she was having yellowish discharge, odor and a little spotting and she is having those same symptoms again. She said the symptoms had completely stopped after taking the medication but have now recurred.

## 2015-12-15 NOTE — Telephone Encounter (Signed)
Call in Flagyl 500 mg one PO BID for 7 days if no improvement or recent change in sexual partner then she will need to come in for further testing.

## 2015-12-16 ENCOUNTER — Telehealth: Payer: Self-pay | Admitting: Hematology

## 2015-12-16 ENCOUNTER — Encounter: Payer: Self-pay | Admitting: Hematology

## 2015-12-16 NOTE — Telephone Encounter (Signed)
Appointment on 01/04/16 @ 11:00am with Dr.Kale

## 2015-12-16 NOTE — Telephone Encounter (Signed)
Patient left message on voicemail re a referral sent over by Dr. Toney Rakes. Per patient office informed her we would call, however she did not want to miss call and wanted to schedule appointment. Returned call but was not able to reach patient and voicemail is not set up. Please see note below - patient to be contacted by referring office.

## 2015-12-16 NOTE — Telephone Encounter (Signed)
Mailed np packet Faxed np letter to referring office to contact pt with appt. Referring  [facility] Dx-anemia due to chronic blood loss Intake

## 2015-12-21 ENCOUNTER — Telehealth: Payer: Self-pay | Admitting: Hematology

## 2015-12-21 NOTE — Telephone Encounter (Signed)
PT CONFIRMED NP APPT °

## 2015-12-22 ENCOUNTER — Ambulatory Visit (INDEPENDENT_AMBULATORY_CARE_PROVIDER_SITE_OTHER): Payer: 59 | Admitting: Women's Health

## 2015-12-22 ENCOUNTER — Encounter: Payer: Self-pay | Admitting: Women's Health

## 2015-12-22 VITALS — BP 120/80

## 2015-12-22 DIAGNOSIS — N760 Acute vaginitis: Secondary | ICD-10-CM | POA: Diagnosis not present

## 2015-12-22 DIAGNOSIS — N938 Other specified abnormal uterine and vaginal bleeding: Secondary | ICD-10-CM | POA: Diagnosis not present

## 2015-12-22 DIAGNOSIS — A499 Bacterial infection, unspecified: Secondary | ICD-10-CM

## 2015-12-22 DIAGNOSIS — B9689 Other specified bacterial agents as the cause of diseases classified elsewhere: Secondary | ICD-10-CM

## 2015-12-22 LAB — WET PREP FOR TRICH, YEAST, CLUE
TRICH WET PREP: NONE SEEN
WBC WET PREP: NONE SEEN
YEAST WET PREP: NONE SEEN

## 2015-12-22 MED ORDER — MEGESTROL ACETATE 40 MG PO TABS: ORAL_TABLET | ORAL | Status: DC

## 2015-12-22 MED ORDER — METRONIDAZOLE 0.75 % VA GEL: VAGINAL | Status: DC

## 2015-12-22 NOTE — Progress Notes (Signed)
Patient ID: Jean English, female   DOB: Mar 31, 1971, 45 y.o.   MRN: [id number] Presents with complaint of continued bleeding with L[place]ta IUD placed 03/2015 for menorrhagia. Has large fibroids 8 cm, 6 cm. Has had problems with anemia, Liletta IUD was placed to try to increase H&H prior to hysterectomy. H&H last week 9.3/29.8 minimal change. Cycles were getting lighter but have gotten progressively heavier since January. LMP March 17  5-6 days of very heavy bleeding changing every 1-2 hours and then flow became lighter but has had bleeding or spotting daily since. Trichomonas treated 07/2015. Was treated with Flagyl last week.  Exam: Appears well. External genitalia within normal limits, speculum exam moderate amount of menses type blood noted, wet prep positive for few clues, GC/Chlamydia culture taken. Bimanual uterus enlarged/fibroids. IUD strings not visible, ultrasound in September showed IUD in uterus proper position.  Irregular bleeding with IUD Fibroid uterus Anemia  Plan: Megace 40 mg twice daily until bleeding stops and then 1 tablet daily. Keep scheduled annual exam appointment with Dr Toney Rakes this month. MetroGel vaginal cream 1 applicator at bedtime 5 nights, alcohol precautions reviewed. Increase iron supplements to twice daily, increase iron rich foods in diet.

## 2015-12-22 NOTE — Patient Instructions (Signed)
Anemia, Nonspecific Anemia is a condition in which the concentration of red blood cells or hemoglobin in the blood is below normal. Hemoglobin is a substance in red blood cells that carries oxygen to the tissues of the body. Anemia results in not enough oxygen reaching these tissues.  CAUSES  Common causes of anemia include:   Excessive bleeding. Bleeding may be internal or external. This includes excessive bleeding from periods (in women) or from the intestine.   Poor nutrition.   Chronic kidney, thyroid, and liver disease.  Bone marrow disorders that decrease red blood cell production.  Cancer and treatments for cancer.  HIV, AIDS, and their treatments.  Spleen problems that increase red blood cell destruction.  Blood disorders.  Excess destruction of red blood cells due to infection, medicines, and autoimmune disorders. SIGNS AND SYMPTOMS   Minor weakness.   Dizziness.   Headache.  Palpitations.   Shortness of breath, especially with exercise.   Paleness.  Cold sensitivity.  Indigestion.  Nausea.  Difficulty sleeping.  Difficulty concentrating. Symptoms may occur suddenly or they may develop slowly.  DIAGNOSIS  Additional blood tests are often needed. These help your health care provider determine the best treatment. Your health care provider will check your stool for blood and look for other causes of blood loss.  TREATMENT  Treatment varies depending on the cause of the anemia. Treatment can include:   Supplements of iron, vitamin B12, or folic acid.   Hormone medicines.   A blood transfusion. This may be needed if blood loss is severe.   Hospitalization. This may be needed if there is significant continual blood loss.   Dietary changes.  Spleen removal. HOME CARE INSTRUCTIONS Keep all follow-up appointments. It often takes many weeks to correct anemia, and having your health care provider check on your condition and your response to  treatment is very important. SEEK IMMEDIATE MEDICAL CARE IF:   You develop extreme weakness, shortness of breath, or chest pain.   You become dizzy or have trouble concentrating.  You develop heavy vaginal bleeding.   You develop a rash.   You have bloody or black, tarry stools.   You faint.   You vomit up blood.   You vomit repeatedly.   You have abdominal pain.  You have a fever or persistent symptoms for more than 2-3 days.   You have a fever and your symptoms suddenly get worse.   You are dehydrated.  MAKE SURE YOU:  Understand these instructions.  Will watch your condition.  Will get help right away if you are not doing well or get worse.   This information is not intended to replace advice given to you by your health care provider. Make sure you discuss any questions you have with your health care provider.   Document Released: 10/11/2004 Document Revised: 05/06/2013 Document Reviewed: 02/27/2013 Elsevier Interactive Patient Education 2016 Elsevier Inc.  

## 2015-12-23 LAB — GC/CHLAMYDIA PROBE AMP
CT PROBE, AMP APTIMA: NOT DETECTED
GC PROBE AMP APTIMA: NOT DETECTED

## 2015-12-28 ENCOUNTER — Encounter: Payer: Self-pay | Admitting: Gynecology

## 2015-12-28 ENCOUNTER — Ambulatory Visit (INDEPENDENT_AMBULATORY_CARE_PROVIDER_SITE_OTHER): Payer: 59 | Admitting: Gynecology

## 2015-12-28 VITALS — BP 120/78 | Ht 66.0 in | Wt 168.0 lb

## 2015-12-28 DIAGNOSIS — Z30432 Encounter for removal of intrauterine contraceptive device: Secondary | ICD-10-CM

## 2015-12-28 DIAGNOSIS — D251 Intramural leiomyoma of uterus: Secondary | ICD-10-CM | POA: Diagnosis not present

## 2015-12-28 DIAGNOSIS — Z01419 Encounter for gynecological examination (general) (routine) without abnormal findings: Secondary | ICD-10-CM | POA: Diagnosis not present

## 2015-12-28 DIAGNOSIS — D500 Iron deficiency anemia secondary to blood loss (chronic): Secondary | ICD-10-CM

## 2015-12-28 DIAGNOSIS — N938 Other specified abnormal uterine and vaginal bleeding: Secondary | ICD-10-CM | POA: Diagnosis not present

## 2015-12-28 LAB — CBC WITH DIFFERENTIAL/PLATELET
BASOS ABS: 0 {cells}/uL (ref 0–200)
Basophils Relative: 0 %
EOS ABS: 0 {cells}/uL — AB (ref 15–500)
Eosinophils Relative: 0 %
HCT: 30.7 % — ABNORMAL LOW (ref 35.0–45.0)
Hemoglobin: 9.4 g/dL — ABNORMAL LOW (ref 11.7–15.5)
LYMPHS PCT: 20 %
Lymphs Abs: 1740 cells/uL (ref 850–3900)
MCH: 23.6 pg — AB (ref 27.0–33.0)
MCHC: 30.6 g/dL — ABNORMAL LOW (ref 32.0–36.0)
MCV: 76.9 fL — AB (ref 80.0–100.0)
MPV: 9.5 fL (ref 7.5–12.5)
Monocytes Absolute: 957 cells/uL — ABNORMAL HIGH (ref 200–950)
Monocytes Relative: 11 %
NEUTROS ABS: 6003 {cells}/uL (ref 1500–7800)
NEUTROS PCT: 69 %
PLATELETS: 383 10*3/uL (ref 140–400)
RBC: 3.99 MIL/uL (ref 3.80–5.10)
RDW: 17.5 % — ABNORMAL HIGH (ref 11.0–15.0)
WBC: 8.7 10*3/uL (ref 3.8–10.8)

## 2015-12-28 LAB — TSH: TSH: 1.36 m[IU]/L

## 2015-12-28 LAB — COMPREHENSIVE METABOLIC PANEL
ALK PHOS: 47 U/L (ref 33–115)
ALT: 11 U/L (ref 6–29)
AST: 17 U/L (ref 10–30)
Albumin: 3.9 g/dL (ref 3.6–5.1)
BUN: 8 mg/dL (ref 7–25)
CALCIUM: 8.9 mg/dL (ref 8.6–10.2)
CO2: 21 mmol/L (ref 20–31)
Chloride: 106 mmol/L (ref 98–110)
Creat: 0.84 mg/dL (ref 0.50–1.10)
GLUCOSE: 83 mg/dL (ref 65–99)
POTASSIUM: 4.2 mmol/L (ref 3.5–5.3)
Sodium: 137 mmol/L (ref 135–146)
Total Bilirubin: 1 mg/dL (ref 0.2–1.2)
Total Protein: 6.5 g/dL (ref 6.1–8.1)

## 2015-12-28 LAB — CHOLESTEROL, TOTAL: Cholesterol: 157 mg/dL (ref 125–200)

## 2015-12-28 MED ORDER — MEGESTROL ACETATE 40 MG PO TABS: 40.0000 mg | ORAL_TABLET | Freq: Two times a day (BID) | ORAL | Status: DC

## 2015-12-28 NOTE — Progress Notes (Signed)
Jean English 1971-05-01 [id number]   History:    45 y.o.  for annual gyn exam who has history of symptomatic leiomyomatous uteri and menorrhagia contributing to her anemia. She had an [place] IUD placed in an effort to control her bleeding and taking iron supplementation in an effort to get her hemoglobin to a safe level to proceed with an abdominal hysterectomy. We will going to place her on Lupron in an effort to buy some time to get her hemoglobin to a safe level but her insurance company would not cover the expense. Her last hemoglobin March 20 was 9.2 g. Her last ultrasound September 2016 demonstrated the following:  Uterus measured 19.2 x 11.6 x 9.2 cm with endometrial stripe is 7.3 mm. Several intramural myomas a larger measured 8.4 x 7.2 x 9.0 cm displacing the endometrial cavity. The cervix a 6.6 x 7.0 x 5.5 cm fibroid as well as a 3.5 x 3.17 m fibroid. Both ovaries were normal. No fluid in the cul-de-sac. Fluid was seen in the lower endometrial cavity.  Patient is having cramping and wanted to have the IUD removed. She is currently on Megace 40 mg twice a day which as containing her bleeding.  She did have a follow-up ultrasound a month after the IUD to confirm the IUD was in the proper position which it was. Patient scheduled to see the hematologist oncologist next week for IV iron infusion and we are going to schedule her surgery shortly thereafter such as with a total abdominal hysterectomy and bilateral salpingectomy.   Past medical history,surgical history, family history and social history were all reviewed and documented in the EPIC chart.  Gynecologic History Patient's last menstrual period was 12/02/2015. Contraception: IUD Last Pap: 2015. Results were: normal Last mammogram: No prior study. Results were: No prior study  Obstetric History OB History  Gravida Para Term Preterm AB SAB TAB Ectopic Multiple Living  2 2        2     # Outcome Date GA Lbr Len/2nd Weight  Sex Delivery Anes PTL Lv  2 Para           1 Para                ROS: A ROS was performed and pertinent positives and negatives are included in the history.  GENERAL: No fevers or chills. HEENT: No change in vision, no earache, sore throat or sinus congestion. NECK: No pain or stiffness. CARDIOVASCULAR: No chest pain or pressure. No palpitations. PULMONARY: No shortness of breath, cough or wheeze. GASTROINTESTINAL: No abdominal pain, nausea, vomiting or diarrhea, melena or bright red blood per rectum. GENITOURINARY: No urinary frequency, urgency, hesitancy or dysuria. MUSCULOSKELETAL: No joint or muscle pain, no back pain, no recent trauma. DERMATOLOGIC: No rash, no itching, no lesions. ENDOCRINE: No polyuria, polydipsia, no heat or cold intolerance. No recent change in weight. HEMATOLOGICAL: No anemia or easy bruising or bleeding. NEUROLOGIC: No headache, seizures, numbness, tingling or weakness. PSYCHIATRIC: No depression, no loss of interest in normal activity or change in sleep pattern.     Exam: chaperone present  BP 120/78 mmHg  Ht 5\' 6"  (1.676 m)  Wt 168 lb (76.204 kg)  BMI 27.13 kg/m2  LMP 12/02/2015  Body mass index is 27.13 kg/(m^2).  General appearance : Well developed well nourished female. No acute distress HEENT: Eyes: no retinal hemorrhage or exudates,  Neck supple, trachea midline, no carotid bruits, no thyroidmegaly Lungs: Clear to auscultation, no rhonchi or  wheezes, or rib retractions  Heart: Regular rate and rhythm, no murmurs or gallops Breast:Examined in sitting and supine position were symmetrical in appearance, no palpable masses or tenderness,  no skin retraction, no nipple inversion, no nipple discharge, no skin discoloration, no axillary or supraclavicular lymphadenopathy Abdomen: no palpable masses or tenderness, no rebound or guarding Extremities: no edema or skin discoloration or tenderness  Pelvic:  Bartholin, Urethra, Skene Glands: Within normal limits              Vagina: No gross lesions or discharge  Cervix: No gross lesions or discharge  Uterus  20 week size irregular uterus nontender  Adnexa  difficult to assess due to size of ovaries  Anus and perineum  normal   Rectovaginal  normal sphincter tone without palpated masses or tenderness             Hemoccult not indicated   The cervix was cleansed with Betadine solution and with the use of a [place] clamp the IUD string was grasped and removed and discarded as per patient's request.  Assessment/Plan:  45 y.o. female for annual exam with symptomatic leiomyomatous uteri contributing to menorrhagia and anemia. Patient requesting to remove the IUD today which it was. She will continue on Megace 40 mg twice a day until a time of her surgery. Patient to see the hematologist oncologist next week and then the following day to return to the office for an ultrasound for follow-up to compare with previous scan last year and to coordinate her surgery. Literature information provided. Patient to continue her iron tablet daily. We'll check her CBC today as well.  Uvaldo Rising H MD, 1:32 PM 12/28/2015

## 2015-12-28 NOTE — Patient Instructions (Signed)
Uterine Fibroids Uterine fibroids are tissue masses (tumors) that can develop in the womb (uterus). They are also called leiomyomas. This type of tumor is not cancerous (benign) and does not spread to other parts of the body outside of the pelvic area, which is between the hip bones. Occasionally, fibroids may develop in the fallopian tubes, in the cervix, or on the support structures (ligaments) that surround the uterus. You can have one or many fibroids. Fibroids can vary in size, weight, and where they grow in the uterus. Some can become quite large. Most fibroids do not require medical treatment. CAUSES A fibroid can develop when a single uterine cell keeps growing (replicating). Most cells in the human body have a control mechanism that keeps them from replicating without control. SIGNS AND SYMPTOMS Symptoms may include:   Heavy bleeding during your period.  Bleeding or spotting between periods.  Pelvic pain and pressure.  Bladder problems, such as needing to urinate more often (urinary frequency) or urgently.  Inability to reproduce offspring (infertility).  Miscarriages. DIAGNOSIS Uterine fibroids are diagnosed through a physical exam. Your health care provider may feel the lumpy tumors during a pelvic exam. Ultrasonography and an MRI may be done to determine the size, location, and number of fibroids. TREATMENT Treatment may include:  Watchful waiting. This involves getting the fibroid checked by your health care provider to see if it grows or shrinks. Follow your health care provider's recommendations for how often to have this checked.  Hormone medicines. These can be taken by mouth or given through an intrauterine device (IUD).  Surgery.  Removing the fibroids (myomectomy) or the uterus (hysterectomy).  Removing blood supply to the fibroids (uterine artery embolization). If fibroids interfere with your fertility and you want to become pregnant, your health care provider  may recommend having the fibroids removed.  HOME CARE INSTRUCTIONS  Keep all follow-up visits as directed by your health care provider. This is important.  Take medicines only as directed by your health care provider.  If you were prescribed a hormone treatment, take the hormone medicines exactly as directed.  Do not take aspirin, because it can cause bleeding.  Ask your health care provider about taking iron pills and increasing the amount of dark green, leafy vegetables in your diet. These actions can help to boost your blood iron levels, which may be affected by heavy menstrual bleeding.  Pay close attention to your period and tell your health care provider about any changes, such as:  Increased blood flow that requires you to use more pads or tampons than usual per month.  A change in the number of days that your period lasts per month.  A change in symptoms that are associated with your period, such as abdominal cramping or back pain. SEEK MEDICAL CARE IF:  You have pelvic pain, back pain, or abdominal cramps that cannot be controlled with medicines.  You have an increase in bleeding between and during periods.  You soak tampons or pads in a half hour or less.  You feel lightheaded, extra tired, or weak. SEEK IMMEDIATE MEDICAL CARE IF:  You faint.  You have a sudden increase in pelvic pain.   This information is not intended to replace advice given to you by your health care provider. Make sure you discuss any questions you have with your health care provider.   Document Released: 08/31/2000 Document Revised: 09/24/2014 Document Reviewed: 03/02/2014 Elsevier Interactive Patient Education 2016 [employer]  

## 2015-12-29 LAB — URINALYSIS W MICROSCOPIC + REFLEX CULTURE
BACTERIA UA: NONE SEEN [HPF]
Bilirubin Urine: NEGATIVE
CRYSTALS: NONE SEEN [HPF]
Casts: NONE SEEN [LPF]
Glucose, UA: NEGATIVE
Ketones, ur: NEGATIVE
Leukocytes, UA: NEGATIVE
Nitrite: NEGATIVE
Specific Gravity, Urine: 1.023 (ref 1.001–1.035)
YEAST: NONE SEEN [HPF]
pH: 5.5 (ref 5.0–8.0)

## 2015-12-30 LAB — URINE CULTURE

## 2016-01-04 ENCOUNTER — Ambulatory Visit (HOSPITAL_BASED_OUTPATIENT_CLINIC_OR_DEPARTMENT_OTHER): Payer: 59

## 2016-01-04 ENCOUNTER — Ambulatory Visit (HOSPITAL_BASED_OUTPATIENT_CLINIC_OR_DEPARTMENT_OTHER): Payer: 59 | Admitting: Hematology

## 2016-01-04 ENCOUNTER — Telehealth: Payer: Self-pay | Admitting: Hematology

## 2016-01-04 ENCOUNTER — Encounter: Payer: Self-pay | Admitting: Hematology

## 2016-01-04 ENCOUNTER — Other Ambulatory Visit (HOSPITAL_BASED_OUTPATIENT_CLINIC_OR_DEPARTMENT_OTHER): Payer: 59

## 2016-01-04 ENCOUNTER — Telehealth: Payer: Self-pay | Admitting: *Deleted

## 2016-01-04 VITALS — BP 119/66 | HR 59 | Temp 98.0°F | Resp 20 | Ht 66.0 in | Wt 173.0 lb

## 2016-01-04 VITALS — BP 113/55 | HR 62

## 2016-01-04 DIAGNOSIS — L659 Nonscarring hair loss, unspecified: Secondary | ICD-10-CM

## 2016-01-04 DIAGNOSIS — D473 Essential (hemorrhagic) thrombocythemia: Secondary | ICD-10-CM

## 2016-01-04 DIAGNOSIS — D500 Iron deficiency anemia secondary to blood loss (chronic): Secondary | ICD-10-CM

## 2016-01-04 DIAGNOSIS — L603 Nail dystrophy: Secondary | ICD-10-CM

## 2016-01-04 DIAGNOSIS — F5089 Other specified eating disorder: Secondary | ICD-10-CM

## 2016-01-04 DIAGNOSIS — F5083 Pica in adults: Secondary | ICD-10-CM

## 2016-01-04 LAB — CBC & DIFF AND RETIC
BASO%: 0.7 % (ref 0.0–2.0)
Basophils Absolute: 0 10*3/uL (ref 0.0–0.1)
EOS%: 0.8 % (ref 0.0–7.0)
Eosinophils Absolute: 0.1 10*3/uL (ref 0.0–0.5)
HCT: 27.6 % — ABNORMAL LOW (ref 34.8–46.6)
HGB: 8.8 g/dL — ABNORMAL LOW (ref 11.6–15.9)
IMMATURE RETIC FRACT: 12.9 % — AB (ref 1.60–10.00)
LYMPH%: 34.4 % (ref 14.0–49.7)
MCH: 24.2 pg — ABNORMAL LOW (ref 25.1–34.0)
MCHC: 31.9 g/dL (ref 31.5–36.0)
MCV: 75.8 fL — AB (ref 79.5–101.0)
MONO#: 0.5 10*3/uL (ref 0.1–0.9)
MONO%: 8.5 % (ref 0.0–14.0)
NEUT#: 3.3 10*3/uL (ref 1.5–6.5)
NEUT%: 55.6 % (ref 38.4–76.8)
PLATELETS: 443 10*3/uL — AB (ref 145–400)
RBC: 3.64 10*6/uL — AB (ref 3.70–5.45)
RDW: 17.3 % — ABNORMAL HIGH (ref 11.2–14.5)
Retic %: 1.03 % (ref 0.70–2.10)
Retic Ct Abs: 37.49 10*3/uL (ref 33.70–90.70)
WBC: 6 10*3/uL (ref 3.9–10.3)
lymph#: 2.1 10*3/uL (ref 0.9–3.3)

## 2016-01-04 LAB — COMPREHENSIVE METABOLIC PANEL
ALBUMIN: 3.4 g/dL — AB (ref 3.5–5.0)
ALK PHOS: 63 U/L (ref 40–150)
ALT: 14 U/L (ref 0–55)
ANION GAP: 10 meq/L (ref 3–11)
AST: 17 U/L (ref 5–34)
BILIRUBIN TOTAL: 0.53 mg/dL (ref 0.20–1.20)
BUN: 9.5 mg/dL (ref 7.0–26.0)
CO2: 20 meq/L — AB (ref 22–29)
Calcium: 9.5 mg/dL (ref 8.4–10.4)
Chloride: 109 mEq/L (ref 98–109)
Creatinine: 1 mg/dL (ref 0.6–1.1)
EGFR: 81 mL/min/{1.73_m2} — AB (ref >90)
Glucose: 78 mg/dl (ref 70–140)
Potassium: 4.1 mEq/L (ref 3.5–5.1)
Sodium: 139 mEq/L (ref 136–145)
TOTAL PROTEIN: 7.4 g/dL (ref 6.4–8.3)

## 2016-01-04 LAB — IRON AND TIBC
%SAT: 4 % — ABNORMAL LOW (ref 21–57)
IRON: 16 ug/dL — AB (ref 41–142)
TIBC: 403 ug/dL (ref 236–444)
UIBC: 387 ug/dL — ABNORMAL HIGH (ref 120–384)

## 2016-01-04 LAB — FERRITIN: FERRITIN: 7 ng/mL — AB (ref 9–269)

## 2016-01-04 MED ORDER — SODIUM CHLORIDE 0.9 % IV SOLN
510.0000 mg | Freq: Once | INTRAVENOUS | Status: AC
  Administered 2016-01-04: 510 mg via INTRAVENOUS
  Filled 2016-01-04: qty 17

## 2016-01-04 MED ORDER — SODIUM CHLORIDE 0.9 % IV SOLN
Freq: Once | INTRAVENOUS | Status: AC
  Administered 2016-01-04: 12:00:00 via INTRAVENOUS

## 2016-01-04 NOTE — Telephone Encounter (Signed)
Pt will p/u appt & avs in tx

## 2016-01-04 NOTE — Progress Notes (Signed)
Jean English Kitchen    HEMATOLOGY/ONCOLOGY CONSULTATION NOTE  Date of Service: 01/04/2016  Patient Care Team: PCP: . Elon Alas NP Gynecology: Uvaldo Rising MD  CHIEF COMPLAINTS/PURPOSE OF CONSULTATION:  Evaluation of iron deficiency anemia  HISTORY OF PRESENTING ILLNESS:  Jean English is a wonderful 45 y.o. female who has been referred to [place] by Dr Terrance Mass, MD for evaluation and management of anemia.  Patient has a remote history of endometriosis treated with laparoscopic surgery in the past. She also has a history of uterine fibroids for which she previously had myomectomy. She notes that she continues to have significant menorrhagia and has had evaluations including a transvaginal ultrasound of the pelvis on 05/25/2015 that showed 2 large fundal fibroids one measured 8.6 x 20 x 7.9 cm and a posterior fibroid measured 6.9 x 7.3 x 6.6 cm.  Patient has had a [place] IUD placed in July 2016 to control her menorrhagia without success. This was removed recently. Dr. Toney Rakes wanted to start her on Lupron but this was not covered by insurance. He plans to offer her a total abdominal hysterectomy and bilateral salpingectomies to control her menorrhagia itches causing significant anemia. She has been on Megace for the last 2 weeks but does not note that her bleeding is controlled. She notes ongoing vaginal bleeding since mid March 2017. She is scheduled for a follow-up ultrasound of her pelvis on 01/06/2016. She was referred to [place] for evaluation and management of her iron deficiency anemia likely related to her significant ongoing menorrhagia to try to optimize her hemoglobin levels prior to her hysterectomy.  Patient notes that she has been on ferrous sulfate 1 tablet daily since about January 2016 and then was increased to 1 tablet twice a days from July 2016 without significant improvement in her hemoglobin levels. She notes significant constipation related to her oral iron replacement which makes her  quite uncomfortable.  She notes that she is having significant cravings for ice and previously for raw almonds. Notes significant hair loss and brittle nails. She is keen to treat her iron deficiency aggressively and get her hysterectomy done.  She notes enlarged uterus causes significant urinary urgency and this is bothering her quite a bit as well.   MEDICAL HISTORY:  Past Medical History  Diagnosis Date  . Incontinence   . Endometriosis     laparoscopy  . Fibroid uterus   . Anemia     SURGICAL HISTORY: Past Surgical History  Procedure Laterality Date  . Cesarean section      X 2  . Knee arthroscopy  1995    RIGHT   . Pelvic laparoscopy  1996    FOR ENDOMETRIOSIS  . Tubal ligation  01/21/2004  . Hysteroscopy  10.10.12    W/REMOVAL OF POLYP    SOCIAL HISTORY: Social History   Social History  . Marital Status: Married    Spouse Name: N/A  . Number of Children: N/A  . Years of Education: N/A   Occupational History  . Not on file.   Social History Main Topics  . Smoking status: Former Smoker    Types: Cigarettes  . Smokeless tobacco: Never Used  . Alcohol Use: 0.0 oz/week    0 Standard drinks or equivalent per week     Comment: OCCASIONALLY  . Drug Use: No  . Sexual Activity: Not Currently    Birth Control/ Protection: None, Surgical     Comment: TUBAL LIGATION- lilleta   Other Topics Concern  . Not on  file   Social History Narrative    FAMILY HISTORY: Family History  Problem Relation Age of Onset  . Hypertension Mother   . Diabetes Mother   . Breast cancer Maternal Aunt 40    DECEASED  . Cancer Maternal Aunt     LUNG  . Cancer Maternal Uncle 32    LUNG    ALLERGIES:  has No Known Allergies.  MEDICATIONS:  Current Outpatient Prescriptions  Medication Sig Dispense Refill  . ferrous sulfate 325 (65 FE) MG tablet Take 325 mg by mouth daily with breakfast.    . megestrol (MEGACE) 40 MG tablet Take 1 tablet (40 mg total) by mouth 2 (two) times  daily. 30 tablet 3  . metroNIDAZOLE (FLAGYL) 500 MG tablet Take 1 tablet (500 mg total) by mouth 2 (two) times daily. (Patient not taking: Reported on 12/28/2015) 14 tablet 0  . metroNIDAZOLE (METROGEL VAGINAL) 0.75 % vaginal gel 1 applicator per vagina at HS x 5 (Patient not taking: Reported on 12/28/2015) 70 g 0   No current facility-administered medications for this visit.    REVIEW OF SYSTEMS:    10 Point review of Systems was done is negative except as noted above.  PHYSICAL EXAMINATION: ECOG PERFORMANCE STATUS: 1 - Symptomatic but completely ambulatory  . Filed Vitals:   01/04/16 1059  BP: 119/66  Pulse: 59  Temp: 98 F (36.7 C)  Resp: 20   Filed Weights   01/04/16 1059  Weight: 173 lb (78.472 kg)   .Body mass index is 27.94 kg/(m^2).  GENERAL:alert, in no acute distress and comfortable SKIN: skin color, texture, turgor are normal, no rashes or significant lesions EYES: normal, conjunctiva are pink and non-injected, sclera clear OROPHARYNX:no exudate, no erythema and lips, buccal mucosa, and tongue normal  NECK: supple, no JVD, thyroid normal size, non-tender, without nodularity LYMPH:  no palpable lymphadenopathy in the cervical, axillary or inguinal LUNGS: clear to auscultation with normal respiratory effort HEART: regular rate & rhythm,  no murmurs and no lower extremity edema ABDOMEN: Lower abdominal distention with palpable enlarged inform uterus extending nearly to the umbilicus. Normoactive bowel sounds no guarding rigidity or rebound . Musculoskeletal: no cyanosis of digits and no clubbing  PSYCH: alert & oriented x 3 with fluent speech NEURO: no focal motor/sensory deficits  LABORATORY DATA:  I have reviewed the data as listed  . CBC Latest Ref Rng 01/04/2016 12/28/2015 12/13/2015  WBC 3.9 - 10.3 10e3/uL 6.0 8.7 5.7  Hemoglobin 11.6 - 15.9 g/dL 8.8(L) 9.4(L) 9.2(L)  Hematocrit 34.8 - 46.6 % 27.6(L) 30.7(L) 29.8(L)  Platelets 145 - 400 10e3/uL 443(H) 383  403(H)   . CBC    Component Value Date/Time   WBC 6.0 01/04/2016 1200   WBC 8.7 12/28/2015 1209   RBC 3.64* 01/04/2016 1200   RBC 3.99 12/28/2015 1209   HGB 8.8* 01/04/2016 1200   HGB 9.4* 12/28/2015 1209   HCT 27.6* 01/04/2016 1200   HCT 30.7* 12/28/2015 1209   PLT 443* 01/04/2016 1200   PLT 383 12/28/2015 1209   MCV 75.8* 01/04/2016 1200   MCV 76.9* 12/28/2015 1209   MCH 24.2* 01/04/2016 1200   MCH 23.6* 12/28/2015 1209   MCHC 31.9 01/04/2016 1200   MCHC 30.6* 12/28/2015 1209   RDW 17.3* 01/04/2016 1200   RDW 17.5* 12/28/2015 1209   LYMPHSABS 2.1 01/04/2016 1200   LYMPHSABS 1740 12/28/2015 1209   MONOABS 0.5 01/04/2016 1200   MONOABS 957* 12/28/2015 1209   EOSABS 0.1 01/04/2016 1200  EOSABS 0* 12/28/2015 1209   BASOSABS 0.0 01/04/2016 1200   BASOSABS 0 12/28/2015 1209     . CMP Latest Ref Rng 01/04/2016 12/28/2015 07/26/2014  Glucose 70 - 140 mg/dl 78 83 87  BUN 7.0 - 26.0 mg/dL 9.5 8 13   Creatinine 0.6 - 1.1 mg/dL 1.0 0.84 1.05  Sodium 136 - 145 mEq/L 139 137 136  Potassium 3.5 - 5.1 mEq/L 4.1 4.2 4.4  Chloride 98 - 110 mmol/L - 106 104  CO2 22 - 29 mEq/L 20(L) 21 23  Calcium 8.4 - 10.4 mg/dL 9.5 8.9 9.1  Total Protein 6.4 - 8.3 g/dL 7.4 6.5 6.6  Total Bilirubin 0.20 - 1.20 mg/dL 0.53 1.0 0.6  Alkaline Phos 40 - 150 U/L 63 47 74  AST 5 - 34 U/L 17 17 17   ALT 0 - 55 U/L 14 11 11    . Lab Results  Component Value Date   IRON 16* 01/04/2016   TIBC 403 01/04/2016   IRONPCTSAT 4* 01/04/2016   (Iron and TIBC)  Lab Results  Component Value Date   FERRITIN 7* 01/04/2016    B12 407   RADIOGRAPHIC STUDIES: I have personally reviewed the radiological images as listed and agreed with the findings in the report. No results found.  ASSESSMENT & PLAN:   #1 severe iron deficiency anemia due to significant ongoing menorrhagia related to huge uterine fibroids. Hemoglobin today is 8.8 with microcytic Indices. Ferritin level of 7 with 4% iron saturation. Ongoing  significant bleeding has made oral iron replacement inadequate. #2 intolerance to oral iron with significant constipation. #3 severe menorrhagia causing iron deficiency. This is likely due to large uterine fibroids. Patient is following up with Dr. Jerilee Hoh. No other evidence of active bleeding other than her menorrhagia. #4 pica symptoms with ice craving likely due to iron deficiency #5 thrombocytosis likely reactive due to iron deficiency. #6 hair loss and brittle nails due to iron deficiency. Plan  -I offered the patient IV iron given her intolerance to oral iron and lack of effect despite a significant trial of oral iron replacement. Also patient is having ongoing menorrhagia and needs to have her hemoglobin corrected more rapidly to optimize this prior to a total abdominal hysterectomy as planned by her gynecologist. -She continues to follow with Dr. Toney Rakes for management of her uterine fibroids causing significant menorrhagia. She has a follow-up transvaginal pelvic ultrasound on 01/06/2016 for reassessment. -If the patient has severe ongoing menorrhagia that needs more urgent hysterectomy would need to proceed with PRBC transfusions preoperatively to a hemoglobin of at least 10. -If her hysterectomy can wait for a few weeks to months we could reassess the responsiveness of her anemia to IV iron. -She was scheduled to start one of 2 doses of IV Feraheme today with her informed consent.  Return to care with Dr. Irene Limbo in 4 weeks with CBC, CMP and ferritin to reassess response to IV iron.  All of the patients questions were answered with apparent satisfaction. The patient knows to call the clinic with any problems, questions or concerns.  I spent 60 minutes counseling the patient face to face. The total time spent in the appointment was 60 minutes and more than 50% was on counseling and direct patient cares.    Sullivan Lone MD [place] AAHIVMS [facility] Hematology/Oncology Physician [facility]  (Office):       [phone] (Work cell):  [phone] (Fax):           [phone]  01/04/2016 11:08 AM

## 2016-01-04 NOTE — Patient Instructions (Signed)

## 2016-01-04 NOTE — Telephone Encounter (Signed)
Per staff message and POF I have scheduled appts. Advised scheduler of appts. JMW  

## 2016-01-05 LAB — VITAMIN B12: Vitamin B12: 407 pg/mL (ref 211–946)

## 2016-01-06 ENCOUNTER — Encounter: Payer: Self-pay | Admitting: Gynecology

## 2016-01-06 ENCOUNTER — Ambulatory Visit (INDEPENDENT_AMBULATORY_CARE_PROVIDER_SITE_OTHER): Payer: 59 | Admitting: Gynecology

## 2016-01-06 ENCOUNTER — Other Ambulatory Visit: Payer: Self-pay | Admitting: Gynecology

## 2016-01-06 ENCOUNTER — Ambulatory Visit (INDEPENDENT_AMBULATORY_CARE_PROVIDER_SITE_OTHER): Payer: 59

## 2016-01-06 ENCOUNTER — Telehealth: Payer: Self-pay

## 2016-01-06 VITALS — BP 124/82

## 2016-01-06 DIAGNOSIS — R9389 Abnormal findings on diagnostic imaging of other specified body structures: Secondary | ICD-10-CM

## 2016-01-06 DIAGNOSIS — D259 Leiomyoma of uterus, unspecified: Secondary | ICD-10-CM | POA: Diagnosis not present

## 2016-01-06 DIAGNOSIS — R938 Abnormal findings on diagnostic imaging of other specified body structures: Secondary | ICD-10-CM | POA: Diagnosis not present

## 2016-01-06 DIAGNOSIS — D251 Intramural leiomyoma of uterus: Secondary | ICD-10-CM | POA: Diagnosis not present

## 2016-01-06 DIAGNOSIS — D500 Iron deficiency anemia secondary to blood loss (chronic): Secondary | ICD-10-CM

## 2016-01-06 DIAGNOSIS — Z01818 Encounter for other preprocedural examination: Secondary | ICD-10-CM

## 2016-01-06 DIAGNOSIS — N852 Hypertrophy of uterus: Secondary | ICD-10-CM

## 2016-01-06 DIAGNOSIS — N938 Other specified abnormal uterine and vaginal bleeding: Secondary | ICD-10-CM | POA: Diagnosis not present

## 2016-01-06 MED ORDER — METOCLOPRAMIDE HCL 10 MG PO TABS: 10.0000 mg | ORAL_TABLET | Freq: Three times a day (TID) | ORAL | Status: DC

## 2016-01-06 MED ORDER — DOCUSATE SODIUM 100 MG PO CAPS: 100.0000 mg | ORAL_CAPSULE | Freq: Two times a day (BID) | ORAL | Status: DC

## 2016-01-06 MED ORDER — OXYCODONE-ACETAMINOPHEN 5-325 MG PO TABS: 1.0000 | ORAL_TABLET | ORAL | Status: DC | PRN

## 2016-01-06 NOTE — Progress Notes (Signed)
Jean English is an 45 y.o. female for preoperative examination. Patient scheduled on May 9 for total abdominal hysterectomy with bilateral salpingectomy. Her history is as follows:  Patient with symptomatic leiomyomatous uteri and menorrhagia contributing to her anemia. She had an [place] IUD placed in an effort to control her bleeding and taking iron supplementation in an effort to get her hemoglobin to a safe level to proceed with an abdominal hysterectomy. We will going to place her on Lupron in an effort to buy some time to get her hemoglobin to a safe level but her insurance company would not cover the expense. Her last hemoglobin March 20 was 9.2 g and on April 19 it was 8.8. Patient had been referred to the hematologist oncologist Dr.Kale who gave her an outpatient IV iron infusion.   Her ultrasound September 2016 demonstrated following: Uterus measured 19.2 x 11.6 x 9.2 cm with endometrial stripe is 7.3 mm. Several intramural myomas a larger measured 8.4 x 7.2 x 9.0 cm displacing the endometrial cavity. The cervix a 6.6 x 7.0 x 5.5 cm fibroid as well as a 3.5 x 3.17 m fibroid. Both ovaries were normal. No fluid in the cul-de-sac. Fluid was seen in the lower endometrial cavity.  Ultrasound today: Uterus measured 19.0 x 13.4 x 10.9 with endometrial stripe of 12.6 mm. Patient with several fibroids largest 1 measuring 36 x 42 mm. Right fundal fibroid 10.0 x 9.9 x 7.9 cm. Right lower uterine segment fibroid 7.5 x 6.1 cm. Endometrium prominent located between fibroids. Right and left ovary normal. No fluid in the cul-de-sac. Slight mild hydronephrosis of the right kidney was noted    Pertinent Gynecological History: Menses: Dysfunctional uterine bleeding Bleeding: dysfunctional uterine bleeding Contraception: tubal ligation DES exposure: unknown Blood transfusions: IV iron infusion 2 Sexually transmitted diseases: no past history Previous GYN Procedures: 2 cesarean sections,  laparoscopy and resectoscopic polypectomy  Last mammogram: normal Date: 2016Last pap: normal Date: 2015  OB History: G 2, Ptorual History: Menarche age: 37  Patient's last menstrual period was 12/02/2015.    Past Medical History  Diagnosis Date  . Incontinence   . Endometriosis     laparoscopy  . Fibroid uterus   . Anemia     Past Surgical History  Procedure Laterality Date  . Cesarean section      X 2  . Knee arthroscopy  1995    RIGHT   . Pelvic laparoscopy  1996    FOR ENDOMETRIOSIS  . Tubal ligation  01/21/2004  . Hysteroscopy  10.10.12    W/REMOVAL OF POLYP    Family History  Problem Relation Age of Onset  . Hypertension Mother   . Diabetes Mother   . Breast cancer Maternal Aunt 40    DECEASED  . Cancer Maternal Aunt     LUNG  . Cancer Maternal Uncle 11    LUNG    Social History:  reports that she has quit smoking. Her smoking use included Cigarettes. She has never used smokeless tobacco. She reports that she drinks alcohol. She reports that she does not use illicit drugs.  Allergies: No Known Allergies   (Not in a hospital admission)  REVIEW OF SYSTEMS: A ROS was performed and pertinent positives and negatives are included in the history.  GENERAL: No fevers or chills. HEENT: No change in vision, no earache, sore throat or sinus congestion. NECK: No pain or stiffness. CARDIOVASCULAR: No chest pain or pressure. No palpitations. PULMONARY: No shortness of breath, cough or  wheeze. GASTROINTESTINAL: No abdominal pain, nausea, vomiting or diarrhea, melena or bright red blood per rectum. GENITOURINARY: No urinary frequency, urgency, hesitancy or dysuria. MUSCULOSKELETAL: No joint or muscle pain, no back pain, no recent trauma. DERMATOLOGIC: No rash, no itching, no lesions. ENDOCRINE: No polyuria, polydipsia, no heat or cold intolerance. No recent change in weight. HEMATOLOGICAL: No anemia or easy bruising or bleeding. NEUROLOGIC: No headache, seizures, numbness,  tingling or weakness. PSYCHIATRIC: No depression, no loss of interest in normal activity or change in sleep pattern.     Blood pressure 124/82, last menstrual period 12/02/2015.  Physical Exam:  HEENT:unremarkable Neck:Supple, midline, no thyroid megaly, no carotid bruits Lungs:  Clear to auscultation no rhonchi's or wheezes Heart:Regular rate and rhythm, no murmurs or gallops Breast Exam: Examined at time of annual exam in April were normal Abdomen fundal height to the umbilicus firm none mobile Pelvic:BUSWithin normal limits  Vagina:No lesions or discharge  Cervix:No lesions or discharge  Uterus:20 week size firm mildly tender difficult to assess adnexa due to size of fibroids Extremities: No cords, no edema Rectnot examined  Assessment/45 year old patient with long-standing history of chronic pelvic discomfort as a result of her leiomyomatous uteri. Dysfunction uterine bleeding contributing to her chronic anemia. Patient been referred to the medical oncologist who had done an IV infusion of iron this week and is scheduled for another infusion next week. We are going to schedule her total abdominal hysterectomy with bilateral salpingectomy for May 9. She will see the oncologist to 3 days before and he will check her CBC he plans on transfusing her unit of blood for hemoglobin is below 10. Patient was counseled about the incision could be from Pfannenstiel versus vertical depending on the mobility of the uterus. The additional risks discussed are as follows:                        Patient was counseled as to the risk of surgery to include the following:  1. Infection (prohylactic antibiotics will be administered)  2. DVT/Pulmonary Embolism (prophylactic pneumo compression stockings will be used)  3.Trauma to internal organs requiring additional surgical procedure to repair any injury to     Internal organs requiring perhaps additional hospitalization days.  4.Hemmorhage requiring  transfusion and blood products which carry risks such as             anaphylactic reaction, hepatitis and AIDS  Patient had received literature information on the procedure scheduled and all her questions were answered and fully accepts all risk.  Patient was provided with prescription for Percocet 5/325 to take 1 by mouth every 4-6 hours postop. Also prescription for Reglan 10 mg to take 1 by mouth every 4-6 hours nausea vomiting postop. Prescription for Colace to take 1 by mouth daily starting now was provided. Literature information was provided. All questions answered.   Marnell Mcdaniel HMD11:04 AMTD@Note :

## 2016-01-06 NOTE — Telephone Encounter (Signed)
I contacted patient regarding surgery scheduled. I discussed her ins benefits with her and her estimated surgery pre-payment amount.  I will mail her a financial letter as well.  She is scheduled for 01/24/16 at 7:30am.  Per Dr. Moshe Salisbury no pre-op appt needed at [facility].  Dr. Moshe Salisbury asked me to call and let Dr. Irene Limbo know the date of surgery as he will be reassessing her labs a week or so before surgery and infusing her if needed. I called and left message on Dr. Grier Mitts nurse's voice mail with this information.

## 2016-01-11 ENCOUNTER — Ambulatory Visit (HOSPITAL_BASED_OUTPATIENT_CLINIC_OR_DEPARTMENT_OTHER): Payer: 59

## 2016-01-11 VITALS — BP 117/75 | HR 54 | Temp 98.9°F

## 2016-01-11 DIAGNOSIS — D500 Iron deficiency anemia secondary to blood loss (chronic): Secondary | ICD-10-CM | POA: Diagnosis not present

## 2016-01-11 MED ORDER — SODIUM CHLORIDE 0.9 % IV SOLN
510.0000 mg | Freq: Once | INTRAVENOUS | Status: AC
  Administered 2016-01-11: 510 mg via INTRAVENOUS
  Filled 2016-01-11: qty 17

## 2016-01-11 MED ORDER — SODIUM CHLORIDE 0.9 % IV SOLN
Freq: Once | INTRAVENOUS | Status: AC
  Administered 2016-01-11: 09:00:00 via INTRAVENOUS

## 2016-01-11 NOTE — Patient Instructions (Signed)

## 2016-01-16 NOTE — Patient Instructions (Signed)
Your procedure is scheduled on:  Tuesday, Jan 24, 2016  Enter through the Main Entrance of [facility] at: 6:00 AM  Pick up the phone at the desk and dial [phone].  Call this number if you have problems the morning of surgery: [phone].  Remember:  Do NOT eat food or drink after:  Midnight Monday  Take these medicines the morning of surgery with a SIP OF WATER:  None  Do NOT wear jewelry (body piercing), metal hair clips/bobby pins, make-up, or nail polish. Do NOT wear lotions, powders, or perfumes.  You may wear deodorant. Do NOT shave for 48 hours prior to surgery. Do NOT bring valuables to the hospital. Contacts, dentures, or bridgework may not be worn into surgery.  Leave suitcase in car.  After surgery it may be brought to your room.  For patients admitted to the hospital, checkout time is 11:00 AM the day of discharge.

## 2016-01-17 ENCOUNTER — Encounter (HOSPITAL_COMMUNITY)
Admission: RE | Admit: 2016-01-17 | Discharge: 2016-01-17 | Disposition: A | Discharge status: Home / Self Care | Payer: 59 | Source: Ambulatory Visit | Attending: Gynecology | Admitting: Gynecology

## 2016-01-17 ENCOUNTER — Encounter (HOSPITAL_COMMUNITY): Payer: Self-pay

## 2016-01-17 DIAGNOSIS — N920 Excessive and frequent menstruation with regular cycle: Secondary | ICD-10-CM | POA: Diagnosis not present

## 2016-01-17 DIAGNOSIS — Z01812 Encounter for preprocedural laboratory examination: Secondary | ICD-10-CM | POA: Diagnosis present

## 2016-01-17 DIAGNOSIS — Z87891 Personal history of nicotine dependence: Secondary | ICD-10-CM | POA: Diagnosis not present

## 2016-01-17 DIAGNOSIS — D259 Leiomyoma of uterus, unspecified: Secondary | ICD-10-CM | POA: Insufficient documentation

## 2016-01-17 DIAGNOSIS — D649 Anemia, unspecified: Secondary | ICD-10-CM | POA: Diagnosis not present

## 2016-01-17 HISTORY — DX: Reserved for inherently not codable concepts without codable children: IMO0001

## 2016-01-17 HISTORY — DX: Unspecified osteoarthritis, unspecified site: M19.90

## 2016-01-17 HISTORY — DX: Frequency of micturition: R35.0

## 2016-01-17 LAB — CBC
HEMATOCRIT: 29.7 % — AB (ref 36.0–46.0)
HEMOGLOBIN: 9.6 g/dL — AB (ref 12.0–15.0)
MCH: 26.4 pg (ref 26.0–34.0)
MCHC: 32.3 g/dL (ref 30.0–36.0)
MCV: 81.8 fL (ref 78.0–100.0)
Platelets: 513 10*3/uL — ABNORMAL HIGH (ref 150–400)
RBC: 3.63 MIL/uL — ABNORMAL LOW (ref 3.87–5.11)
RDW: 23.7 % — AB (ref 11.5–15.5)
WBC: 5 10*3/uL (ref 4.0–10.5)

## 2016-01-17 LAB — TYPE AND SCREEN
ABO/RH(D): O POS
ANTIBODY SCREEN: NEGATIVE

## 2016-01-17 LAB — ABO/RH: ABO/RH(D): O POS

## 2016-01-18 ENCOUNTER — Telehealth: Payer: Self-pay | Admitting: *Deleted

## 2016-01-18 ENCOUNTER — Encounter: Payer: Self-pay | Admitting: Gynecology

## 2016-01-18 ENCOUNTER — Telehealth: Payer: Self-pay

## 2016-01-18 NOTE — Telephone Encounter (Signed)
TC from patient requesting results of CBC done yesterday as pre-op for hysterectomy that is scheduled for 01/24/16. HGB is 9.6 Per Dr. Grier Mitts note on 01/04/16, he states that he would like her HGB to be at least 10 prior to surgery. Pt asking if she needs to have transfusion now-prior to Tuesday's surgery.  Please advise and return call to pt @ [phone]

## 2016-01-18 NOTE — Telephone Encounter (Signed)
I called patient because Dr. Moshe Salisbury had told me previously that her Hematologist office would check lab week before surgery and if low will infuse her again 2-3 days prior to surgery. Her surgery is next Tuesday. I had called and provided surgery date to his nurse.  Patient has not heard from them but said she will call them today to get this arranged.  (She did note she is feeling better and she has stopped eating ice and she is hoping that is a good sign.)

## 2016-01-19 NOTE — Telephone Encounter (Signed)
Informed patient that she will not need a transfusion at this time. Patient verbalized understanding.

## 2016-01-23 MED ORDER — DEXTROSE 5 % IV SOLN
2.0000 g | INTRAVENOUS | Status: AC
  Administered 2016-01-24: 2 g via INTRAVENOUS
  Filled 2016-01-23: qty 2

## 2016-01-24 ENCOUNTER — Inpatient Hospital Stay (HOSPITAL_COMMUNITY): Payer: 59 | Admitting: Anesthesiology

## 2016-01-24 ENCOUNTER — Inpatient Hospital Stay (HOSPITAL_COMMUNITY)
Admission: RE | Admit: 2016-01-24 | Discharge: 2016-01-26 | DRG: 743 | Disposition: A | Discharge status: Home / Self Care | Payer: 59 | Source: Ambulatory Visit | Attending: Gynecology | Admitting: Gynecology

## 2016-01-24 ENCOUNTER — Encounter (HOSPITAL_COMMUNITY)
Admission: RE | Disposition: A | Discharge status: Home / Self Care | Payer: Self-pay | Source: Ambulatory Visit | Attending: Gynecology

## 2016-01-24 ENCOUNTER — Encounter (HOSPITAL_COMMUNITY): Payer: Self-pay | Admitting: *Deleted

## 2016-01-24 DIAGNOSIS — Z9889 Other specified postprocedural states: Secondary | ICD-10-CM

## 2016-01-24 DIAGNOSIS — N920 Excessive and frequent menstruation with regular cycle: Secondary | ICD-10-CM | POA: Diagnosis present

## 2016-01-24 DIAGNOSIS — D250 Submucous leiomyoma of uterus: Secondary | ICD-10-CM | POA: Diagnosis present

## 2016-01-24 DIAGNOSIS — N938 Other specified abnormal uterine and vaginal bleeding: Secondary | ICD-10-CM | POA: Diagnosis not present

## 2016-01-24 DIAGNOSIS — D259 Leiomyoma of uterus, unspecified: Secondary | ICD-10-CM | POA: Diagnosis present

## 2016-01-24 DIAGNOSIS — D509 Iron deficiency anemia, unspecified: Secondary | ICD-10-CM | POA: Diagnosis present

## 2016-01-24 DIAGNOSIS — D500 Iron deficiency anemia secondary to blood loss (chronic): Secondary | ICD-10-CM

## 2016-01-24 DIAGNOSIS — R102 Pelvic and perineal pain: Secondary | ICD-10-CM | POA: Diagnosis present

## 2016-01-24 DIAGNOSIS — Z87891 Personal history of nicotine dependence: Secondary | ICD-10-CM

## 2016-01-24 LAB — CBC
HCT: 29.8 % — ABNORMAL LOW (ref 36.0–46.0)
HEMATOCRIT: 28.9 % — AB (ref 36.0–46.0)
HEMOGLOBIN: 9.6 g/dL — AB (ref 12.0–15.0)
Hemoglobin: 9.6 g/dL — ABNORMAL LOW (ref 12.0–15.0)
MCH: 27.5 pg (ref 26.0–34.0)
MCH: 27 pg (ref 26.0–34.0)
MCHC: 33.2 g/dL (ref 30.0–36.0)
MCHC: 32.2 g/dL (ref 30.0–36.0)
MCV: 82.8 fL (ref 78.0–100.0)
MCV: 83.7 fL (ref 78.0–100.0)
PLATELETS: 337 10*3/uL (ref 150–400)
Platelets: 309 10*3/uL (ref 150–400)
RBC: 3.49 MIL/uL — AB (ref 3.87–5.11)
RBC: 3.56 MIL/uL — AB (ref 3.87–5.11)
RDW: 22.9 % — ABNORMAL HIGH (ref 11.5–15.5)
RDW: 22.6 % — AB (ref 11.5–15.5)
WBC: 3.9 10*3/uL — ABNORMAL LOW (ref 4.0–10.5)
WBC: 8.9 10*3/uL (ref 4.0–10.5)

## 2016-01-24 LAB — PREGNANCY, URINE: Preg Test, Ur: NEGATIVE

## 2016-01-24 SURGERY — HYSTERECTOMY, TOTAL, ABDOMINAL, WITH SALPINGECTOMY
Anesthesia: General | Laterality: Bilateral

## 2016-01-24 MED ORDER — LACTATED RINGERS IV SOLN
INTRAVENOUS | Status: DC
  Administered 2016-01-24: 125 mL/h via INTRAVENOUS
  Administered 2016-01-24: 16:00:00 via INTRAVENOUS
  Administered 2016-01-25: 125 mL/h via INTRAVENOUS

## 2016-01-24 MED ORDER — DIPHENHYDRAMINE HCL 50 MG/ML IJ SOLN: 12.5000 mg | Freq: Four times a day (QID) | INTRAMUSCULAR | Status: DC | PRN

## 2016-01-24 MED ORDER — DIPHENHYDRAMINE HCL 12.5 MG/5ML PO ELIX: 12.5000 mg | ORAL_SOLUTION | Freq: Four times a day (QID) | ORAL | Status: DC | PRN

## 2016-01-24 MED ORDER — OXYCODONE HCL 5 MG/5ML PO SOLN: 5.0000 mg | Freq: Once | ORAL | Status: DC | PRN

## 2016-01-24 MED ORDER — ONDANSETRON HCL 4 MG/2ML IJ SOLN
INTRAMUSCULAR | Status: DC | PRN
  Administered 2016-01-24: 4 mg via INTRAVENOUS

## 2016-01-24 MED ORDER — SCOPOLAMINE 1 MG/3DAYS TD PT72
MEDICATED_PATCH | TRANSDERMAL | Status: AC
  Administered 2016-01-24: 1.5 mg via TRANSDERMAL
  Filled 2016-01-24: qty 1

## 2016-01-24 MED ORDER — NEOSTIGMINE METHYLSULFATE 10 MG/10ML IV SOLN
INTRAVENOUS | Status: AC
Start: 2016-01-24 — End: 2016-01-24
  Filled 2016-01-24: qty 1

## 2016-01-24 MED ORDER — FENTANYL CITRATE (PF) 100 MCG/2ML IJ SOLN
INTRAMUSCULAR | Status: AC
  Filled 2016-01-24: qty 2

## 2016-01-24 MED ORDER — SODIUM CHLORIDE 0.9% FLUSH: 9.0000 mL | INTRAVENOUS | Status: DC | PRN

## 2016-01-24 MED ORDER — PROPOFOL 10 MG/ML IV BOLUS
INTRAVENOUS | Status: DC | PRN
  Administered 2016-01-24: 200 mg via INTRAVENOUS

## 2016-01-24 MED ORDER — NALOXONE HCL 0.4 MG/ML IJ SOLN: 0.4000 mg | INTRAMUSCULAR | Status: DC | PRN

## 2016-01-24 MED ORDER — GLYCOPYRROLATE 0.2 MG/ML IJ SOLN
INTRAMUSCULAR | Status: AC
  Filled 2016-01-24: qty 2

## 2016-01-24 MED ORDER — LIDOCAINE HCL (CARDIAC) 20 MG/ML IV SOLN
INTRAVENOUS | Status: AC
  Filled 2016-01-24: qty 5

## 2016-01-24 MED ORDER — HYDROMORPHONE HCL 1 MG/ML IJ SOLN
INTRAMUSCULAR | Status: AC
  Filled 2016-01-24: qty 1

## 2016-01-24 MED ORDER — SODIUM CHLORIDE 0.9 % IJ SOLN
INTRAMUSCULAR | Status: AC
  Filled 2016-01-24: qty 50

## 2016-01-24 MED ORDER — HYDROMORPHONE HCL 1 MG/ML IJ SOLN
0.2500 mg | INTRAMUSCULAR | Status: DC | PRN
  Administered 2016-01-24: 0.5 mg via INTRAVENOUS

## 2016-01-24 MED ORDER — ROCURONIUM BROMIDE 100 MG/10ML IV SOLN
INTRAVENOUS | Status: AC
  Filled 2016-01-24: qty 1

## 2016-01-24 MED ORDER — GLYCOPYRROLATE 0.2 MG/ML IJ SOLN
INTRAMUSCULAR | Status: DC | PRN
  Administered 2016-01-24: .6 mg via INTRAVENOUS

## 2016-01-24 MED ORDER — BUPIVACAINE HCL (PF) 0.25 % IJ SOLN
INTRAMUSCULAR | Status: AC
  Filled 2016-01-24: qty 30

## 2016-01-24 MED ORDER — DOCUSATE SODIUM 100 MG PO CAPS
100.0000 mg | ORAL_CAPSULE | Freq: Two times a day (BID) | ORAL | Status: DC
  Administered 2016-01-24 – 2016-01-26 (×3): 100 mg via ORAL
  Filled 2016-01-24 (×4): qty 1

## 2016-01-24 MED ORDER — SODIUM CHLORIDE 0.9 % IR SOLN
Status: DC | PRN
  Administered 2016-01-24: 2000 mL

## 2016-01-24 MED ORDER — SCOPOLAMINE 1 MG/3DAYS TD PT72
1.0000 | MEDICATED_PATCH | Freq: Once | TRANSDERMAL | Status: DC
  Administered 2016-01-24: 1.5 mg via TRANSDERMAL

## 2016-01-24 MED ORDER — LACTATED RINGERS IV SOLN
INTRAVENOUS | Status: DC
  Administered 2016-01-24 (×3): via INTRAVENOUS

## 2016-01-24 MED ORDER — MORPHINE SULFATE 2 MG/ML IV SOLN
INTRAVENOUS | Status: DC
  Administered 2016-01-24: 6 mg via INTRAVENOUS
  Administered 2016-01-24: 4.5 mg via INTRAVENOUS
  Administered 2016-01-24: 5 mg via INTRAVENOUS
  Administered 2016-01-24: 11:00:00 via INTRAVENOUS
  Administered 2016-01-25: 0 mg via INTRAVENOUS
  Administered 2016-01-25: 3 mg via INTRAVENOUS
  Administered 2016-01-25: 4 mg via INTRAVENOUS
  Filled 2016-01-24: qty 25

## 2016-01-24 MED ORDER — DEXAMETHASONE SODIUM PHOSPHATE 4 MG/ML IJ SOLN
INTRAMUSCULAR | Status: AC
  Filled 2016-01-24: qty 1

## 2016-01-24 MED ORDER — ONDANSETRON HCL 4 MG/2ML IJ SOLN: 4.0000 mg | Freq: Four times a day (QID) | INTRAMUSCULAR | Status: DC | PRN

## 2016-01-24 MED ORDER — ONDANSETRON HCL 4 MG/2ML IJ SOLN: 4.0000 mg | Freq: Once | INTRAMUSCULAR | Status: DC | PRN

## 2016-01-24 MED ORDER — LIDOCAINE HCL (CARDIAC) 20 MG/ML IV SOLN
INTRAVENOUS | Status: DC | PRN
  Administered 2016-01-24: 80 mg via INTRAVENOUS

## 2016-01-24 MED ORDER — OXYCODONE-ACETAMINOPHEN 5-325 MG PO TABS
1.0000 | ORAL_TABLET | ORAL | Status: DC | PRN
  Administered 2016-01-25 – 2016-01-26 (×5): 1 via ORAL
  Filled 2016-01-24 (×5): qty 1

## 2016-01-24 MED ORDER — BUPIVACAINE LIPOSOME 1.3 % IJ SUSP
20.0000 mL | Freq: Once | INTRAMUSCULAR | Status: AC
  Administered 2016-01-24: 30 mL
  Filled 2016-01-24: qty 20

## 2016-01-24 MED ORDER — ROCURONIUM BROMIDE 100 MG/10ML IV SOLN
INTRAVENOUS | Status: DC | PRN
  Administered 2016-01-24: 20 mg via INTRAVENOUS
  Administered 2016-01-24: 50 mg via INTRAVENOUS
  Administered 2016-01-24: 10 mg via INTRAVENOUS

## 2016-01-24 MED ORDER — NEOSTIGMINE METHYLSULFATE 10 MG/10ML IV SOLN
INTRAVENOUS | Status: DC | PRN
  Administered 2016-01-24: 4 mg via INTRAVENOUS

## 2016-01-24 MED ORDER — KETOROLAC TROMETHAMINE 30 MG/ML IJ SOLN
INTRAMUSCULAR | Status: AC
  Filled 2016-01-24: qty 1

## 2016-01-24 MED ORDER — MIDAZOLAM HCL 2 MG/2ML IJ SOLN
INTRAMUSCULAR | Status: DC | PRN
  Administered 2016-01-24: 2 mg via INTRAVENOUS

## 2016-01-24 MED ORDER — FENTANYL CITRATE (PF) 100 MCG/2ML IJ SOLN
INTRAMUSCULAR | Status: DC | PRN
  Administered 2016-01-24 (×3): 50 ug via INTRAVENOUS
  Administered 2016-01-24: 100 ug via INTRAVENOUS
  Administered 2016-01-24 (×4): 50 ug via INTRAVENOUS

## 2016-01-24 MED ORDER — KETOROLAC TROMETHAMINE 30 MG/ML IJ SOLN
INTRAMUSCULAR | Status: DC | PRN
  Administered 2016-01-24: 30 mg via INTRAVENOUS

## 2016-01-24 MED ORDER — ONDANSETRON HCL 4 MG/2ML IJ SOLN
INTRAMUSCULAR | Status: AC
Start: 2016-01-24 — End: 2016-01-24
  Filled 2016-01-24: qty 2

## 2016-01-24 MED ORDER — HYDROMORPHONE HCL 1 MG/ML IJ SOLN
INTRAMUSCULAR | Status: DC | PRN
  Administered 2016-01-24: 1 mg via INTRAVENOUS

## 2016-01-24 MED ORDER — BUPIVACAINE HCL (PF) 0.25 % IJ SOLN
INTRAMUSCULAR | Status: DC | PRN
  Administered 2016-01-24: 30 mL

## 2016-01-24 MED ORDER — VASOPRESSIN 20 UNIT/ML IV SOLN
INTRAVENOUS | Status: AC
  Filled 2016-01-24: qty 1

## 2016-01-24 MED ORDER — FENTANYL CITRATE (PF) 250 MCG/5ML IJ SOLN
INTRAMUSCULAR | Status: AC
  Filled 2016-01-24: qty 5

## 2016-01-24 MED ORDER — DEXAMETHASONE SODIUM PHOSPHATE 4 MG/ML IJ SOLN
INTRAMUSCULAR | Status: DC | PRN
  Administered 2016-01-24: 4 mg via INTRAVENOUS

## 2016-01-24 MED ORDER — METOCLOPRAMIDE HCL 10 MG PO TABS
10.0000 mg | ORAL_TABLET | Freq: Three times a day (TID) | ORAL | Status: DC
  Administered 2016-01-25 – 2016-01-26 (×3): 10 mg via ORAL
  Filled 2016-01-24 (×4): qty 1

## 2016-01-24 MED ORDER — PROPOFOL 10 MG/ML IV BOLUS
INTRAVENOUS | Status: AC
  Filled 2016-01-24: qty 20

## 2016-01-24 MED ORDER — OXYCODONE HCL 5 MG PO TABS: 5.0000 mg | ORAL_TABLET | Freq: Once | ORAL | Status: DC | PRN

## 2016-01-24 MED ORDER — MIDAZOLAM HCL 2 MG/2ML IJ SOLN
INTRAMUSCULAR | Status: AC
Start: 2016-01-24 — End: 2016-01-24
  Filled 2016-01-24: qty 2

## 2016-01-24 SURGICAL SUPPLY — 52 items
BARRIER ADHS 3X4 INTERCEED (GAUZE/BANDAGES/DRESSINGS) IMPLANT
BRR ADH 4X3 ABS CNTRL BYND (GAUZE/BANDAGES/DRESSINGS)
CANISTER SUCT 3000ML (MISCELLANEOUS) ×3 IMPLANT
CLOSURE WOUND 1/2 X4 (GAUZE/BANDAGES/DRESSINGS)
CLOTH BEACON ORANGE TIMEOUT ST (SAFETY) ×3 IMPLANT
DECANTER SPIKE VIAL GLASS SM (MISCELLANEOUS) IMPLANT
DRAPE CESAREAN BIRTH W POUCH (DRAPES) ×3 IMPLANT
DRAPE WARM FLUID 44X44 (DRAPE) ×2 IMPLANT
DRSG OPSITE POSTOP 4X10 (GAUZE/BANDAGES/DRESSINGS) ×3 IMPLANT
DRSG XEROFORM 1X8 (GAUZE/BANDAGES/DRESSINGS) ×3 IMPLANT
DURAPREP 26ML APPLICATOR (WOUND CARE) ×3 IMPLANT
GAUZE SPONGE 4X4 16PLY XRAY LF (GAUZE/BANDAGES/DRESSINGS) IMPLANT
GLOVE BIOGEL PI IND STRL 7.0 (GLOVE) ×3 IMPLANT
GLOVE BIOGEL PI IND STRL 8 (GLOVE) ×1 IMPLANT
GLOVE BIOGEL PI INDICATOR 7.0 (GLOVE) ×6
GLOVE BIOGEL PI INDICATOR 8 (GLOVE) ×2
GLOVE ECLIPSE 7.5 STRL STRAW (GLOVE) ×6 IMPLANT
GOWN STRL REUS W/TWL LRG LVL3 (GOWN DISPOSABLE) ×9 IMPLANT
HEMOSTAT ARISTA ABSORB 3G PWDR (MISCELLANEOUS) ×2 IMPLANT
NEEDLE HYPO 22GX1.5 SAFETY (NEEDLE) ×2 IMPLANT
NS IRRIG 1000ML POUR BTL (IV SOLUTION) ×5 IMPLANT
PACK ABDOMINAL GYN (CUSTOM PROCEDURE TRAY) ×3 IMPLANT
PAD OB MATERNITY 4.3X12.25 (PERSONAL CARE ITEMS) ×3 IMPLANT
PENCIL SMOKE EVAC W/HOLSTER (ELECTROSURGICAL) ×3 IMPLANT
RETRACTOR WND ALEXIS 25 LRG (MISCELLANEOUS) IMPLANT
RTRCTR WOUND ALEXIS 25CM LRG (MISCELLANEOUS)
SPONGE GAUZE 4X4 12PLY STER LF (GAUZE/BANDAGES/DRESSINGS) ×6 IMPLANT
SPONGE LAP 18X18 X RAY DECT (DISPOSABLE) ×6 IMPLANT
STAPLER VISISTAT 35W (STAPLE) ×3 IMPLANT
STRIP CLOSURE SKIN 1/2X4 (GAUZE/BANDAGES/DRESSINGS) IMPLANT
SUT CHROMIC 3 0 SH 27 (SUTURE) IMPLANT
SUT PDS AB 0 CTX 60 (SUTURE) ×2 IMPLANT
SUT VIC AB 0 CT1 18XCR BRD8 (SUTURE) ×3 IMPLANT
SUT VIC AB 0 CT1 36 (SUTURE) IMPLANT
SUT VIC AB 0 CT1 8-18 (SUTURE) ×9
SUT VIC AB 1 CT1 18XBRD ANBCTR (SUTURE) IMPLANT
SUT VIC AB 1 CT1 8-18 (SUTURE)
SUT VIC AB 2-0 CT1 27 (SUTURE) ×6
SUT VIC AB 2-0 CT1 TAPERPNT 27 (SUTURE) IMPLANT
SUT VIC AB 3-0 CT1 27 (SUTURE) ×12
SUT VIC AB 3-0 CT1 TAPERPNT 27 (SUTURE) ×2 IMPLANT
SUT VIC AB 3-0 SH 27 (SUTURE) ×3
SUT VIC AB 3-0 SH 27X BRD (SUTURE) ×1 IMPLANT
SUT VICRYL 0 TIES 12 18 (SUTURE) ×3 IMPLANT
SUT VICRYL 2 0 18  TIES (SUTURE) ×2
SUT VICRYL 2 0 18 TIES (SUTURE) IMPLANT
SUT VICRYL 3 0 BR 18  UND (SUTURE)
SUT VICRYL 3 0 BR 18 UND (SUTURE) IMPLANT
SYR CONTROL 10ML LL (SYRINGE) ×2 IMPLANT
TOWEL OR 17X24 6PK STRL BLUE (TOWEL DISPOSABLE) ×6 IMPLANT
TRAY FOLEY CATH SILVER 14FR (SET/KITS/TRAYS/PACK) ×3 IMPLANT
WATER STERILE IRR 1000ML POUR (IV SOLUTION) ×1 IMPLANT

## 2016-01-24 NOTE — Anesthesia Postprocedure Evaluation (Signed)
Anesthesia Post Note  Patient: LOLITTA HANAS  Procedure(s) Performed: Procedure(s) (LRB): HYSTERECTOMY ABDOMINAL WITH SALPINGECTOMY (Bilateral)  Patient location during evaluation: [facility] Anesthesia Type: General Level of consciousness: awake and alert Pain management: satisfactory to patient Vital Signs Assessment: post-procedure vital signs reviewed and stable Respiratory status: spontaneous breathing and respiratory function stable Cardiovascular status: stable Postop Assessment: adequate PO intake Anesthetic complications: no     Last Vitals:  Filed Vitals:   01/24/16 1215 01/24/16 1333  BP: 137/72 122/81  Pulse: 67 55  Temp: 36.9 C   Resp: 16 16    Last Pain:  Filed Vitals:   01/24/16 1333  PainSc: 2    Pain Goal: Patients Stated Pain Goal: 3 (01/24/16 1100)               Trishelle Devora

## 2016-01-24 NOTE — Transfer of Care (Signed)
Immediate Anesthesia Transfer of Care Note  Patient: ACEY DOBBERSTEIN  Procedure(s) Performed: Procedure(s): HYSTERECTOMY ABDOMINAL WITH SALPINGECTOMY (Bilateral)  Patient Location: PACU  Anesthesia Type:General  Level of Consciousness: awake, alert  and oriented  Airway & Oxygen Therapy: Patient Spontanous Breathing and Patient connected to nasal cannula oxygen  Post-op Assessment: Report given to RN and Post -op Vital signs reviewed and stable  Post vital signs: Reviewed and stable  Last Vitals:  Filed Vitals:   01/24/16 0603  BP: 125/75  Pulse: 63  Temp: 36.8 C  Resp: 16    Last Pain: There were no vitals filed for this visit.    Patients Stated Pain Goal: 4 ([id number])  Complications: No apparent anesthesia complications

## 2016-01-24 NOTE — Addendum Note (Signed)
Addendum  created 01/24/16 1417 by Flossie Dibble, CRNA   Modules edited: Notes Section   Notes Section:  File: [id number]

## 2016-01-24 NOTE — Anesthesia Procedure Notes (Signed)
Procedure Name: Intubation Date/Time: 01/24/2016 7:38 AM Performed by: Elenore Paddy Pre-anesthesia Checklist: Patient identified, Emergency Drugs available, Suction available, Patient being monitored and Timeout performed Patient Re-evaluated:Patient Re-evaluated prior to inductionOxygen Delivery Method: Circle system utilized Preoxygenation: Pre-oxygenation with 100% oxygen Intubation Type: IV induction Ventilation: Mask ventilation without difficulty Laryngoscope Size: Mac and 3 Grade View: Grade II Tube type: Oral Tube size: 7.0 mm Number of attempts: 2 Airway Equipment and Method: Stylet Placement Confirmation: ETT inserted through vocal cords under direct vision,  positive ETCO2 and breath sounds checked- equal and bilateral Secured at: 22 cm Tube secured with: Tape Dental Injury: Teeth and Oropharynx as per pre-operative assessment

## 2016-01-24 NOTE — H&P (View-Only) (Signed)
Jean English is an 45 y.o. female for preoperative examination. Patient scheduled on May 9 for total abdominal hysterectomy with bilateral salpingectomy. Her history is as follows:  Patient with symptomatic leiomyomatous uteri and menorrhagia contributing to her anemia. She had an [place] IUD placed in an effort to control her bleeding and taking iron supplementation in an effort to get her hemoglobin to a safe level to proceed with an abdominal hysterectomy. We will going to place her on Lupron in an effort to buy some time to get her hemoglobin to a safe level but her insurance company would not cover the expense. Her last hemoglobin March 20 was 9.2 g and on April 19 it was 8.8. Patient had been referred to the hematologist oncologist Dr.Kale who gave her an outpatient IV iron infusion.   Her ultrasound September 2016 demonstrated following: Uterus measured 19.2 x 11.6 x 9.2 cm with endometrial stripe is 7.3 mm. Several intramural myomas a larger measured 8.4 x 7.2 x 9.0 cm displacing the endometrial cavity. The cervix a 6.6 x 7.0 x 5.5 cm fibroid as well as a 3.5 x 3.17 m fibroid. Both ovaries were normal. No fluid in the cul-de-sac. Fluid was seen in the lower endometrial cavity.  Ultrasound today: Uterus measured 19.0 x 13.4 x 10.9 with endometrial stripe of 12.6 mm. Patient with several fibroids largest 1 measuring 36 x 42 mm. Right fundal fibroid 10.0 x 9.9 x 7.9 cm. Right lower uterine segment fibroid 7.5 x 6.1 cm. Endometrium prominent located between fibroids. Right and left ovary normal. No fluid in the cul-de-sac. Slight mild hydronephrosis of the right kidney was noted    Pertinent Gynecological History: Menses: Dysfunctional uterine bleeding Bleeding: dysfunctional uterine bleeding Contraception: tubal ligation DES exposure: unknown Blood transfusions: IV iron infusion 2 Sexually transmitted diseases: no past history Previous GYN Procedures: 2 cesarean sections,  laparoscopy and resectoscopic polypectomy  Last mammogram: normal Date: 2016Last pap: normal Date: 2015  OB History: G 2, Ptorual History: Menarche age: 37  Patient's last menstrual period was 12/02/2015.    Past Medical History  Diagnosis Date  . Incontinence   . Endometriosis     laparoscopy  . Fibroid uterus   . Anemia     Past Surgical History  Procedure Laterality Date  . Cesarean section      X 2  . Knee arthroscopy  1995    RIGHT   . Pelvic laparoscopy  1996    FOR ENDOMETRIOSIS  . Tubal ligation  01/21/2004  . Hysteroscopy  10.10.12    W/REMOVAL OF POLYP    Family History  Problem Relation Age of Onset  . Hypertension Mother   . Diabetes Mother   . Breast cancer Maternal Aunt 40    DECEASED  . Cancer Maternal Aunt     LUNG  . Cancer Maternal Uncle 11    LUNG    Social History:  reports that she has quit smoking. Her smoking use included Cigarettes. She has never used smokeless tobacco. She reports that she drinks alcohol. She reports that she does not use illicit drugs.  Allergies: No Known Allergies   (Not in a hospital admission)  REVIEW OF SYSTEMS: A ROS was performed and pertinent positives and negatives are included in the history.  GENERAL: No fevers or chills. HEENT: No change in vision, no earache, sore throat or sinus congestion. NECK: No pain or stiffness. CARDIOVASCULAR: No chest pain or pressure. No palpitations. PULMONARY: No shortness of breath, cough or  wheeze. GASTROINTESTINAL: No abdominal pain, nausea, vomiting or diarrhea, melena or bright red blood per rectum. GENITOURINARY: No urinary frequency, urgency, hesitancy or dysuria. MUSCULOSKELETAL: No joint or muscle pain, no back pain, no recent trauma. DERMATOLOGIC: No rash, no itching, no lesions. ENDOCRINE: No polyuria, polydipsia, no heat or cold intolerance. No recent change in weight. HEMATOLOGICAL: No anemia or easy bruising or bleeding. NEUROLOGIC: No headache, seizures, numbness,  tingling or weakness. PSYCHIATRIC: No depression, no loss of interest in normal activity or change in sleep pattern.     Blood pressure 124/82, last menstrual period 12/02/2015.  Physical Exam:  HEENT:unremarkable Neck:Supple, midline, no thyroid megaly, no carotid bruits Lungs:  Clear to auscultation no rhonchi's or wheezes Heart:Regular rate and rhythm, no murmurs or gallops Breast Exam: Examined at time of annual exam in April were normal Abdomen fundal height to the umbilicus firm none mobile Pelvic:BUSWithin normal limits  Vagina:No lesions or discharge  Cervix:No lesions or discharge  Uterus:20 week size firm mildly tender difficult to assess adnexa due to size of fibroids Extremities: No cords, no edema Rectnot examined  Assessment/45 year old patient with long-standing history of chronic pelvic discomfort as a result of her leiomyomatous uteri. Dysfunction uterine bleeding contributing to her chronic anemia. Patient been referred to the medical oncologist who had done an IV infusion of iron this week and is scheduled for another infusion next week. We are going to schedule her total abdominal hysterectomy with bilateral salpingectomy for May 9. She will see the oncologist to 3 days before and he will check her CBC he plans on transfusing her unit of blood for hemoglobin is below 10. Patient was counseled about the incision could be from Pfannenstiel versus vertical depending on the mobility of the uterus. The additional risks discussed are as follows:                        Patient was counseled as to the risk of surgery to include the following:  1. Infection (prohylactic antibiotics will be administered)  2. DVT/Pulmonary Embolism (prophylactic pneumo compression stockings will be used)  3.Trauma to internal organs requiring additional surgical procedure to repair any injury to     Internal organs requiring perhaps additional hospitalization days.  4.Hemmorhage requiring  transfusion and blood products which carry risks such as             anaphylactic reaction, hepatitis and AIDS  Patient had received literature information on the procedure scheduled and all her questions were answered and fully accepts all risk.  Patient was provided with prescription for Percocet 5/325 to take 1 by mouth every 4-6 hours postop. Also prescription for Reglan 10 mg to take 1 by mouth every 4-6 hours nausea vomiting postop. Prescription for Colace to take 1 by mouth daily starting now was provided. Literature information was provided. All questions answered.   Rahel Carlton HMD11:04 AMTD@Note :

## 2016-01-24 NOTE — Anesthesia Postprocedure Evaluation (Signed)
Anesthesia Post Note  Patient: Jean English  Procedure(s) Performed: Procedure(s) (LRB): HYSTERECTOMY ABDOMINAL WITH SALPINGECTOMY (Bilateral)  Patient location during evaluation: PACU Anesthesia Type: General Level of consciousness: awake, awake and alert and oriented Pain management: pain level controlled Respiratory status: spontaneous breathing, nonlabored ventilation and respiratory function stable Cardiovascular status: blood pressure returned to baseline Anesthetic complications: no    Last Vitals:  Filed Vitals:   01/24/16 1030 01/24/16 1050  BP: 125/81 134/73  Pulse: 54 53  Temp: 36.7 C 36.6 C  Resp: 16 16    Last Pain:  Filed Vitals:   01/24/16 1052  PainSc: 4                  Xian Alves COKER

## 2016-01-24 NOTE — Op Note (Signed)
Operative Note  01/24/2016  12:39 PM  PATIENT:  Jean English  45 y.o. female  PRE-OPERATIVE DIAGNOSIS:  symptomatic leiomyomatous uter  POST-OPERATIVE DIAGNOSIS:  symptomatic leiomyomatous uter  PROCEDURE:  Procedure(s): HYSTERECTOMY ABDOMINAL WITH SALPINGECTOMY  SURGEON:  Surgeon(s): Terrance Mass, MD Anastasio Auerbach, MD  ANESTHESIA:   general  FINDINGS: 18 week size fibroid uterus with intramural and submucosal fibroids. Evidence of previous tubal ligation. Normal-appearing fallopian tubes and normal-appearing ovaries.  DESCRIPTION OF OPERATION: Patient received 2 g of Cefotan for prophylaxis as well as had PSA stockings for DVT prophylaxis as well. A timeout was undertaken the patient was properly identified and the procedure to be performed was voiced out loud. The abdomen and vagina were prepped and draped in usual sterile fashion a Foley catheter had been inserted from [place] issue of urinary output. A Pfannenstiel skin incision was made 2 cm above the symphysis pubis incision was carried over the skin and subcutaneous tissue down to the rectus fascia. A midline neck was made and the peritoneal cavity was entered. At this time it was noted patient had an 18 week size uterus freely mobile no pelvic adhesions except scarring the lower uterine segment from 2 previous cesarean section. The uterus was able to be pulled out through the incision site and with countertraction on both sides the hysterectomy was able to be performed the following fashion. The right utero-ovarian ligament and right proximal fallopian tube were placed under tension with a straight Heaney clamp the right round ligament was identified and was transected. The anterior broad ligament was incised to the level of the lower uterine segment to free the bladder from the lower uterine segment. With the surgeon's finger the posterior broad ligament was penetrated and a Haney clamp was placed between the ovary  and the utero ovarian ligaments and proximal tube and was incised. The pedicle was tied with 0 Vicryl suture free tied followed by transfixation stitch. Similar procedure was carried out on the contralateral side. On both sides the parametrium was clamped cut and suture ligated with 0 Vicryl suture taking care the broad and cardinal ligament to the level of the lower uterine segment near the uterine artery which was clamped cut and suture ligated with 0 Vicryl suture. The size of the uterus and the uterus was amputated from the cervix and passed off the operative field. The remaining cervical stump was treated in the following fashion. With straight Coker clamps hugging the cervix on both sides was cut and suture ligated with 0 Vicryl suture. 2 curved Heaney clamps were placed at the cervical vaginal region and with the use of Jorgenson scissors the cervix was freed from the vagina and passed off the operative field. The 2 angles were secured with a transfixation stitch of 0 Vicryl suture and the remaining vaginal cuff was reapproximated with interrupted sutures 0 Vicryl. The pelvic cavity was then cut was irrigated with normal saline solution. Arista hemostatic agent was applied to the raw surface of the peritoneum for additional hemostasis after the pelvic washings. Of note prior to this both of the fallopian tubes had been removed by placing a Kelly clamp at the mesosalpinx and transecting the right and left fallopian tube respectively in the mesosalpinx approximated with a stitch of 2-0 Vicryl suture. The sponge count needle count was correct the Kirt Boys retractor was removed. The visceral peritoneum was not reapproximated. The rectus fascia was closed with a running stitch of 0 Vicryl suture. X Eden Lathe was  infiltrated into the fascia and subcutaneous tissue for postoperative analgesia. The subcutaneous tissue was then reapproximated with a running stitch of 3-0 Vicryl suture and the skin edges were  reapproximated with staples. A Xeroform gauze and 4 x 4 dressings were applied. Patient received Toradol 30 mg IV in route to the recovery room. She was extubated and transferred to recovery room with stable vital signs. Blood loss was 300 cc.   ESTIMATED BLOOD LOSS: 300 cc  Intake/Output Summary (Last 24 hours) at 01/24/16 1239 Last data filed at 01/24/16 1038  Gross per 24 hour  Intake   2300 ml  Output    500 ml  Net   1800 ml     BLOOD ADMINISTERED:none   LOCAL MEDICATIONS USED:  OTHER Exparel  SPECIMEN:  Source of Specimen:  Uterus, cervox and bilateral fallopian tubes  DISPOSITION OF SPECIMEN:  PATHOLOGY  COUNTS:  YES  PLAN OF CARE: Transfer to [place] HMD12:39 PMTD@

## 2016-01-24 NOTE — Interval H&P Note (Signed)
History and Physical Interval Note:  01/24/2016 7:00 AM  Jean English  has presented today for surgery, with the diagnosis of symptomatic leiomyomatous uteri  The various methods of treatment have been discussed with the patient and family. After consideration of risks, benefits and other options for treatment, the patient has consented to  Procedure(s): HYSTERECTOMY ABDOMINAL WITH SALPINGECTOMY (Bilateral) as a surgical intervention .  The patient's history has been reviewed, patient examined, no change in status, stable for surgery.  I have reviewed the patient's chart and labs.  Questions were answered to the patient's satisfaction.     Terrance Mass

## 2016-01-24 NOTE — Addendum Note (Signed)
Addendum  created 01/24/16 1757 by Flossie Dibble, CRNA   Modules edited: Notes Section   Notes Section:  File: [id number]

## 2016-01-24 NOTE — Anesthesia Preprocedure Evaluation (Addendum)
Anesthesia Evaluation  Patient identified by MRN, date of birth, ID band Patient awake    Reviewed: NPO status , Patient's Chart, lab work & pertinent test results  Airway Mallampati: II  TM Distance: >3 FB Neck ROM: Full    Dental  (+) Teeth Intact, Dental Advisory Given   Pulmonary former smoker,    breath sounds clear to auscultation       Cardiovascular  Rhythm:Regular Rate:Normal     Neuro/Psych    GI/Hepatic   Endo/Other    Renal/GU      Musculoskeletal   Abdominal   Peds  Hematology   Anesthesia Other Findings   Reproductive/Obstetrics                            Anesthesia Physical Anesthesia Plan  ASA: II  Anesthesia Plan: General   Post-op Pain Management:    Induction: Intravenous  Airway Management Planned: Oral ETT  Additional Equipment:   Intra-op Plan:   Post-operative Plan: Extubation in OR  Informed Consent: I have reviewed the patients History and Physical, chart, labs and discussed the procedure including the risks, benefits and alternatives for the proposed anesthesia with the patient or authorized representative who has indicated his/her understanding and acceptance.   Dental advisory given  Plan Discussed with: CRNA and Anesthesiologist  Anesthesia Plan Comments:        Anesthesia Quick Evaluation

## 2016-01-24 NOTE — Anesthesia Postprocedure Evaluation (Signed)
Anesthesia Post Note  Patient: Jean English  Procedure(s) Performed: Procedure(s) (LRB): HYSTERECTOMY ABDOMINAL WITH SALPINGECTOMY (Bilateral)  Patient location during evaluation: [facility] Anesthesia Type: General Level of consciousness: awake and alert Pain management: satisfactory to patient Vital Signs Assessment: post-procedure vital signs reviewed and stable Respiratory status: spontaneous breathing and respiratory function stable Cardiovascular status: stable Postop Assessment: adequate PO intake Anesthetic complications: no     Last Vitals:  Filed Vitals:   01/24/16 1453 01/24/16 1715  BP: 115/74 121/83  Pulse: 54 52  Temp: 36.8 C 36.8 C  Resp: 17 17    Last Pain:  Filed Vitals:   01/24/16 1716  PainSc: 2    Pain Goal: Patients Stated Pain Goal: 3 (01/24/16 1100)               Lucilia Yanni

## 2016-01-25 ENCOUNTER — Other Ambulatory Visit: Payer: 59

## 2016-01-25 ENCOUNTER — Ambulatory Visit: Payer: 59 | Admitting: Hematology

## 2016-01-25 LAB — CBC
HCT: 27.5 % — ABNORMAL LOW (ref 36.0–46.0)
Hemoglobin: 9 g/dL — ABNORMAL LOW (ref 12.0–15.0)
MCH: 27.3 pg (ref 26.0–34.0)
MCHC: 32.7 g/dL (ref 30.0–36.0)
MCV: 83.3 fL (ref 78.0–100.0)
PLATELETS: 279 10*3/uL (ref 150–400)
RBC: 3.3 MIL/uL — AB (ref 3.87–5.11)
RDW: 22.6 % — AB (ref 11.5–15.5)
WBC: 6 10*3/uL (ref 4.0–10.5)

## 2016-01-25 NOTE — Progress Notes (Signed)
1 Day Post-Op Procedure(s) (LRB): HYSTERECTOMY ABDOMINAL WITH SALPINGECTOMY (Bilateral)  Subjective: Patient reports incisional pain.    Objective: I have reviewed patient's vital signs, intake and output, medications and labs.  General: alert and cooperative Resp: clear to auscultation bilaterally Cardio: regular rate and rhythm, S1, S2 normal, no murmur, click, rub or gallop GI: soft, non-tender; bowel sounds normal; no masses,  no organomegaly Extremities: extremities normal, atraumatic, no cyanosis or edema  Preop hemoglobin 9.6 on May 9 last p.m. at 1800 hrs. hemoglobin was 9.6 this morning hemoglobin 9.0   Assessment: s/p Procedure(s): HYSTERECTOMY ABDOMINAL WITH SALPINGECTOMY (Bilateral): stable and progressing well  Plan: Advance diet Encourage ambulation Advance to PO medication Discontinue IV fluids  DC PCA pump  LOS: 1 day    FERNANDEZ,JUAN H 01/25/2016, 8:05 AM

## 2016-01-26 NOTE — Discharge Summary (Signed)
Physician Discharge Summary  Patient ID: Jean English MRN: [id number] DOB/AGE: Oct 28, 1970 45 y.o.  Admit date: 01/24/2016 Discharge date: 01/26/2016  Admission Diagnoses:Symptomatic leiomyomatous uteri and chronic anemia and pelvic pain  Discharge Diagnoses: Same Active Problems:   Postoperative state   Discharged Condition: good  Hospital Course: Patient was noted to the hospital on 01/24/2016 where she underwent a total abdominal hysterectomy with bilateral salpingectomy as a result of a large leiomyomatous uteri contributing to her chronic anemia and abdominal pelvic pains. Patient did well intraoperatively she lost 300 cc her preoperative hemoglobin was 9.6 a few days prior patient received IV iron infusion at the hematologist oncologist office. Postoperatively that evening her hemoglobin was 9.6 and on the following postoperative day #1 hemoglobin was 9.0. Her vital signs were stable she remained afebrile had good urinary output. This morning she was tolerating full regular diet had been ambulating but had not passed gas yet but was rated be discharged home her PCA pump discontinued yesterday she had been on oral pain medication.  Consults: None  Significant Diagnostic Studies: labs: Preop hemoglobin 9.6 postop hemoglobin 9.0  Pathology report: Diagnosis 1. Uterus and cervix - CERVIX: MILD CHRONIC INFLAMMATION. - ENDOMETRIUM: BENIGN PROLIFERATIVE TYPE ENDOMETRIUM. - TUBAL METAPLASIA. - MYOMETRIUM: LEIOMYOMATA. - SEROSA: UNREMARKABLE. 2. Fallopian tube, left - BENIGN FALLOPIAN TUBE. - THERE IS NO EVIDENCE OF MALIGNANCY. 3. Fallopian tube, right - BENIGN FALLOPIAN TUBE. - THERE IS NO EVIDENCE OF MALIGNANCY  Treatments: surgery: Total abdominal hysterectomy with bilateral salpingectomy  Discharge Exam: Blood pressure 110/71, pulse 57, temperature 97.8 F (36.6 C), temperature source Oral, resp. rate 16, height 5\' 7"  (1.702 m), weight 171 lb (77.565 kg), SpO2 99  %. General appearance: alert Resp: clear to auscultation bilaterally Cardio: regular rate and rhythm, S1, S2 normal, no murmur, click, rub or gallop GI: soft, non-tender; bowel sounds normal; no masses,  no organomegaly Extremities: extremities normal, atraumatic, no cyanosis or edema Incision/Wound:  Disposition: 01-Home or Self Care  Discharge Instructions    Call MD for:  difficulty breathing, headache or visual disturbances    Complete by:  As directed      Call MD for:  hives    Complete by:  As directed      Call MD for:  persistant dizziness or light-headedness    Complete by:  As directed      Call MD for:  persistant nausea and vomiting    Complete by:  As directed      Call MD for:  redness, tenderness, or signs of infection (pain, swelling, redness, odor or green/yellow discharge around incision site)    Complete by:  As directed      Call MD for:  severe uncontrolled pain    Complete by:  As directed      Call MD for:  temperature >100.4    Complete by:  As directed      Diet general    Complete by:  As directed      Discharge instructions    Complete by:  As directed   Come to office next Tuesday to remove staples at: 10 am Hysterectomy, Abdominal & Vaginal Care After Refer to this sheet in the next few weeks. These discharge instructions provide you with general information on caring for yourself after you leave the hospital. Your caregiver may also give you specific instructions. Your treatment has been planned according to the most current medical practices available, but unavoidable complications sometimes occur. If you have any  problems or questions after discharge, please call your caregiver. HOME CARE INSTRUCTIONS Healing will take time. You will have tenderness at the surgery site. There may be some swelling and bruising around this area if you had an abdominal hysterectomy. Have an adult stay with you the first 48 to 72 hours after surgery, and then for 1 to 2  weeks afterward to help with daily activities. Only take over-the-counter or prescription medicines for pain, discomfort, or fever as directed by your caregiver.  Do not take aspirin. It can cause bleeding.  Do not drive when taking pain medicine.  It will be normal to be sore for a couple weeks after surgery. See your caregiver if this seems to be getting worse rather than better.  Follow your caregiver's advice regarding diet, exercise, lifting, driving, and general activities.  Take showers instead of baths for a few weeks as directed.  You may resume your usual diet as directed.  Get plenty of rest and sleep.  Do not douche, use tampons, or have sexual intercourse until your caregiver says it is okay.  Change your bandages (dressings) as directed if you had an abdominal hysterectomy.  Take your temperature twice a day and write it down.  Do not drink alcohol until your caregiver says it is okay.  If you develop constipation, you may take a mild laxative with your caregiver's permission. Eating bran foods helps with constipation problems. Drink enough water and fluids to keep your urine clear or pale yellow.  Do not sign any legal documents until you feel normal again.  Keep all of your follow-up appointments.  Make sure you and your family understand everything about your operation and recovery.  SEEK MEDICAL CARE IF: There is swelling, redness, or increasing pain in the wound area.  Fluid (pus) is coming from the wound.  You notice a bad smell coming from the wound or surgical dressing.  You have pain, redness, and swelling from the intravenous (IV) site.  The wound breaks open.  You feel dizzy.  You develop pain or bleeding when you urinate.  You develop diarrhea.  You feel sick to your stomach (nauseous) and throw up (vomit).  You develop abnormal vaginal discharge.  You develop a rash.  You have any type of abnormal reaction or develop an allergy to your medicine.  Your pain  medicine is not relieving your pain.  seek immediate medical care if: You have an oral temperature above 100, not controlled by medicine. HYYou develop chest pain.  You develop shortness of breath.  You pass out (faint).  You develop pain, swelling, or redness of your leg.  You develop heavy vaginal bleeding, with or without blood clots.  MAKE SURE YOU: Understand these instructions.  Will watch your condition.  Will get help right away if you are not doing well or get worse.  Document Released: 11/24/2003 Document Re-Released: 02/21/2010 [facility] Patient Information 2011 [place].   [facility] HMD7:49 AMTD@     Driving Restrictions    Complete by:  As directed   1 week     Increase activity slowly    Complete by:  As directed      Lifting restrictions    Complete by:  As directed   6 weeks     Sexual Activity Restrictions    Complete by:  As directed   6 weeks            Medication List    STOP taking these medications  megestrol 40 MG tablet  Commonly known as:  MEGACE      TAKE these medications        docusate sodium 100 MG capsule  Commonly known as:  COLACE  Take 1 capsule (100 mg total) by mouth 2 (two) times daily.     ferrous sulfate 325 (65 FE) MG tablet  Take 325 mg by mouth 2 (two) times daily with a meal.     ibuprofen 200 MG tablet  Commonly known as:  ADVIL,MOTRIN  Take 600-800 mg by mouth every 6 (six) hours as needed for mild pain or moderate pain.     metoCLOPramide 10 MG tablet  Commonly known as:  REGLAN  Take 1 tablet (10 mg total) by mouth 3 (three) times daily with meals.     oxyCODONE-acetaminophen 5-325 MG tablet  Commonly known as:  PERCOCET  Take 1 tablet by mouth every 4 (four) hours as needed for severe pain.         SignedTerrance Mass 01/26/2016, 8:03 AM

## 2016-01-26 NOTE — Progress Notes (Signed)
Pt out in wheelchair teaching complete  

## 2016-01-30 ENCOUNTER — Telehealth: Payer: Self-pay

## 2016-01-30 NOTE — Telephone Encounter (Signed)
I called patient to let her know I spoke with [employer]. This morning and they do not have a medical records release form on file that they can send me. I told her she will need to fill one out when she is here tomorrow for visit so that I can fax these forms to disability [employer].

## 2016-01-31 ENCOUNTER — Ambulatory Visit (INDEPENDENT_AMBULATORY_CARE_PROVIDER_SITE_OTHER): Payer: 59 | Admitting: Gynecology

## 2016-01-31 ENCOUNTER — Encounter: Payer: Self-pay | Admitting: Gynecology

## 2016-01-31 VITALS — BP 118/72

## 2016-01-31 DIAGNOSIS — Z4802 Encounter for removal of sutures: Secondary | ICD-10-CM

## 2016-01-31 NOTE — Progress Notes (Signed)
   Patient is a 45 year old who presented to the office to remove her staples from her Pfannenstiel incision. Patient had 2 prior cesarean section before her recent total abdominal hysterectomy with bilateral salpingectomy. The reason for hysterectomy was that she was complaining of cramping along with menorrhagia and severe anemia. She had an approximately 18 week size fibroid uterus. She is doing well she was a little constipated in the beginning but took mag citrate and is currently taking Colace is having normal bowel movements. No GU complaints. Patient preop had a hemoglobin 9.6 postop hemoglobin was 9.0. Patient is started her R supplementation.  Exam: Incision site intact Staples were removed and Steri-Strips were placed   Final pathology report:  Diagnosis 1. Uterus and cervix - CERVIX: MILD CHRONIC INFLAMMATION. - ENDOMETRIUM: BENIGN PROLIFERATIVE TYPE ENDOMETRIUM. - TUBAL METAPLASIA. - MYOMETRIUM: LEIOMYOMATA. - SEROSA: UNREMARKABLE. 2. Fallopian tube, left - BENIGN FALLOPIAN TUBE. - THERE IS NO EVIDENCE OF MALIGNANCY. 3. Fallopian tube, right - BENIGN FALLOPIAN TUBE. - THERE IS NO EVIDENCE OF MALIGNANCY  Patient to return to the office in 3 weeks for her first complete postop exam and we'll be checking her CBC then.

## 2016-02-08 ENCOUNTER — Ambulatory Visit: Payer: 59 | Admitting: Gynecology

## 2016-02-23 ENCOUNTER — Ambulatory Visit (INDEPENDENT_AMBULATORY_CARE_PROVIDER_SITE_OTHER): Payer: 59 | Admitting: Gynecology

## 2016-02-23 ENCOUNTER — Encounter: Payer: Self-pay | Admitting: Gynecology

## 2016-02-23 VITALS — BP 128/86

## 2016-02-23 DIAGNOSIS — Z09 Encounter for follow-up examination after completed treatment for conditions other than malignant neoplasm: Secondary | ICD-10-CM

## 2016-02-23 DIAGNOSIS — D500 Iron deficiency anemia secondary to blood loss (chronic): Secondary | ICD-10-CM

## 2016-02-23 LAB — CBC WITH DIFFERENTIAL/PLATELET
BASOS ABS: 67 {cells}/uL (ref 0–200)
Basophils Relative: 1 %
EOS ABS: 804 {cells}/uL — AB (ref 15–500)
Eosinophils Relative: 12 %
HCT: 35.8 % (ref 35.0–45.0)
HEMOGLOBIN: 11.3 g/dL — AB (ref 11.7–15.5)
LYMPHS ABS: 3015 {cells}/uL (ref 850–3900)
Lymphocytes Relative: 45 %
MCH: 27.8 pg (ref 27.0–33.0)
MCHC: 31.6 g/dL — ABNORMAL LOW (ref 32.0–36.0)
MCV: 88 fL (ref 80.0–100.0)
MPV: 9.3 fL (ref 7.5–12.5)
Monocytes Absolute: 469 cells/uL (ref 200–950)
Monocytes Relative: 7 %
NEUTROS PCT: 35 %
Neutro Abs: 2345 cells/uL (ref 1500–7800)
Platelets: 484 10*3/uL — ABNORMAL HIGH (ref 140–400)
RBC: 4.07 MIL/uL (ref 3.80–5.10)
RDW: 19.4 % — ABNORMAL HIGH (ref 11.0–15.0)
WBC: 6.7 10*3/uL (ref 3.8–10.8)

## 2016-02-23 NOTE — Progress Notes (Signed)
   Patient presented to the office today for 3 week postop visit.Patient had 2 prior cesarean section before her recent total abdominal hysterectomy with bilateral salpingectomy. The reason for hysterectomy was that she was complaining of cramping along with menorrhagia and severe anemia. She had an approximately 18 week size fibroid uterus. She is doing well otherwise. She is taking her iron supplementation due to the fact that her preop hemoglobin was 9.6 and her postop hemoglobin approximate 4 weeks ago was 9.0.  Final pathology report:  Diagnosis 1. Uterus and cervix - CERVIX: MILD CHRONIC INFLAMMATION. - ENDOMETRIUM: BENIGN PROLIFERATIVE TYPE ENDOMETRIUM. - TUBAL METAPLASIA. - MYOMETRIUM: LEIOMYOMATA. - SEROSA: UNREMARKABLE. 2. Fallopian tube, left - BENIGN FALLOPIAN TUBE. - THERE IS NO EVIDENCE OF MALIGNANCY. 3. Fallopian tube, right - BENIGN FALLOPIAN TUBE. - THERE IS NO EVIDENCE OF MALIGNANCY  Exam: Pfannenstiel incision completely healed. Abdomen soft nontender no rebound or guarding Pelvic: Bartholin urethra Skene was within normal limits Vagina: No lesions or discharge Vaginal cuff intact Bimanual exam unremarkable Rectal exam not done  Assessment/plan: Patient 3-1/2 weeks status post total abdominal hysterectomy with bilateral salpingectomy as a result of symptomatic leiomyomatous uteri doing well. She will continue her iron supplementation. We'll check her CBC today. Patient scheduled to return back in 2 weeks for final postop visit. 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 She was encouraged not to do any lifting or strenuous activity or any intercourse until her final postop visit.

## 2016-03-08 ENCOUNTER — Ambulatory Visit: Payer: 59 | Admitting: Gynecology

## 2016-03-09 ENCOUNTER — Ambulatory Visit: Payer: 59 | Admitting: Gynecology

## 2016-03-14 ENCOUNTER — Encounter: Payer: Self-pay | Admitting: Gynecology

## 2016-03-14 ENCOUNTER — Ambulatory Visit (INDEPENDENT_AMBULATORY_CARE_PROVIDER_SITE_OTHER): Payer: 59 | Admitting: Gynecology

## 2016-03-14 VITALS — BP 120/80

## 2016-03-14 DIAGNOSIS — D500 Iron deficiency anemia secondary to blood loss (chronic): Secondary | ICD-10-CM

## 2016-03-14 DIAGNOSIS — Z09 Encounter for follow-up examination after completed treatment for conditions other than malignant neoplasm: Secondary | ICD-10-CM

## 2016-03-14 LAB — CBC WITH DIFFERENTIAL/PLATELET
BASOS PCT: 3 %
Basophils Absolute: 117 cells/uL (ref 0–200)
EOS ABS: 195 {cells}/uL (ref 15–500)
Eosinophils Relative: 5 %
HEMATOCRIT: 38.5 % (ref 35.0–45.0)
Hemoglobin: 12.3 g/dL (ref 11.7–15.5)
LYMPHS PCT: 52 %
Lymphs Abs: 2028 cells/uL (ref 850–3900)
MCH: 27.8 pg (ref 27.0–33.0)
MCHC: 31.9 g/dL — ABNORMAL LOW (ref 32.0–36.0)
MCV: 86.9 fL (ref 80.0–100.0)
MONO ABS: 312 {cells}/uL (ref 200–950)
MPV: 9.8 fL (ref 7.5–12.5)
Monocytes Relative: 8 %
Neutro Abs: 1248 cells/uL — ABNORMAL LOW (ref 1500–7800)
Neutrophils Relative %: 32 %
Platelets: 289 10*3/uL (ref 140–400)
RBC: 4.43 MIL/uL (ref 3.80–5.10)
RDW: 17.9 % — AB (ref 11.0–15.0)
WBC: 3.9 10*3/uL (ref 3.8–10.8)

## 2016-03-14 NOTE — Progress Notes (Signed)
   HPI: Patient is a 45 year old that presented to the office today for her six-week postop visit patient is status post total abdominal hysterectomy with bilateral salpingectomy as a result of symptomatic leiomyomatous uteri (menorrhagia and severe anemia).She had an approximately 18 week size fibroid uterus. She is doing well otherwise. She is taking her iron supplementation due to the fact that her preop hemoglobin was 9.6 and her postop hemoglobin last office visit on June 8 was up to 11.3 g. Patient is very cheerful asymptomatic has return back to work with no complaints today. Her pathology report had demonstrated the following:  Diagnosis 1. Uterus and cervix - CERVIX: MILD CHRONIC INFLAMMATION. - ENDOMETRIUM: BENIGN PROLIFERATIVE TYPE ENDOMETRIUM. - TUBAL METAPLASIA. - MYOMETRIUM: LEIOMYOMATA. - SEROSA: UNREMARKABLE. 2. Fallopian tube, left - BENIGN FALLOPIAN TUBE. - THERE IS NO EVIDENCE OF MALIGNANCY. 3. Fallopian tube, right - BENIGN FALLOPIAN TUBE. - THERE IS NO EVIDENCE OF MALIGNANCY   ROS: A ROS was performed and pertinent positives and negatives are included in the history.  GENERAL: No fevers or chills. HEENT: No change in vision, no earache, sore throat or sinus congestion. NECK: No pain or stiffness. CARDIOVASCULAR: No chest pain or pressure. No palpitations. PULMONARY: No shortness of breath, cough or wheeze. GASTROINTESTINAL: No abdominal pain, nausea, vomiting or diarrhea, melena or bright red blood per rectum. GENITOURINARY: No urinary frequency, urgency, hesitancy or dysuria. MUSCULOSKELETAL: No joint or muscle pain, no back pain, no recent trauma. DERMATOLOGIC: No rash, no itching, no lesions. ENDOCRINE: No polyuria, polydipsia, no heat or cold intolerance. No recent change in weight. HEMATOLOGICAL: No anemia or easy bruising or bleeding. NEUROLOGIC: No headache, seizures, numbness, tingling or weakness. PSYCHIATRIC: No depression, no loss of interest in normal activity or  change in sleep pattern.   PE: Pfannenstiel incision completely healed. Abdomen soft nontender no rebound or guarding Pelvic: Bartholin urethra Skene was within normal limits Vagina: No lesions or discharge Vaginal cuff intact Bimanual exam unremarkable Rectal exam not done  Assessment Plan: Patient 6 weeks status post total abdominal hysterectomy with bilateral salpingectomy as a result of symptomatic leiomyomatous uteri with normal exam today has resumed full normal activity. We'll check her CBC today if her hemoglobin is back to normal she'll discontinue her iron supplementation. We will otherwise see her back in one year for annual exam or when necessary. She was reminded was again to schedule her overdue mammogram.

## 2016-03-15 ENCOUNTER — Other Ambulatory Visit: Payer: Self-pay | Admitting: Gynecology

## 2016-03-15 DIAGNOSIS — D500 Iron deficiency anemia secondary to blood loss (chronic): Secondary | ICD-10-CM

## 2016-12-28 ENCOUNTER — Encounter: Payer: 59 | Admitting: Gynecology

## 2016-12-28 DIAGNOSIS — Z0289 Encounter for other administrative examinations: Secondary | ICD-10-CM

## 2017-01-30 ENCOUNTER — Encounter: Payer: Self-pay | Admitting: Gynecology

## 2020-03-01 ENCOUNTER — Other Ambulatory Visit: Payer: Self-pay

## 2020-03-02 ENCOUNTER — Ambulatory Visit: Payer: No Typology Code available for payment source | Admitting: Family Medicine

## 2020-03-02 ENCOUNTER — Encounter: Payer: Self-pay | Admitting: Family Medicine

## 2020-03-02 ENCOUNTER — Encounter: Payer: Self-pay | Admitting: Gastroenterology

## 2020-03-02 VITALS — BP 120/68 | HR 61 | Temp 97.6°F | Ht 66.0 in | Wt 148.8 lb

## 2020-03-02 DIAGNOSIS — Z0000 Encounter for general adult medical examination without abnormal findings: Secondary | ICD-10-CM | POA: Diagnosis not present

## 2020-03-02 DIAGNOSIS — Z8371 Family history of colonic polyps: Secondary | ICD-10-CM

## 2020-03-02 DIAGNOSIS — N809 Endometriosis, unspecified: Secondary | ICD-10-CM | POA: Diagnosis not present

## 2020-03-02 DIAGNOSIS — Z23 Encounter for immunization: Secondary | ICD-10-CM

## 2020-03-02 DIAGNOSIS — Z86018 Personal history of other benign neoplasm: Secondary | ICD-10-CM

## 2020-03-02 LAB — HEPATIC FUNCTION PANEL
ALT: 18 U/L (ref 0–35)
AST: 28 U/L (ref 0–37)
Albumin: 4.4 g/dL (ref 3.5–5.2)
Alkaline Phosphatase: 46 U/L (ref 39–117)
Bilirubin, Direct: 0.2 mg/dL (ref 0.0–0.3)
Total Bilirubin: 1 mg/dL (ref 0.2–1.2)
Total Protein: 6.7 g/dL (ref 6.0–8.3)

## 2020-03-02 LAB — BASIC METABOLIC PANEL
BUN: 15 mg/dL (ref 6–23)
CO2: 30 mEq/L (ref 19–32)
Calcium: 9.3 mg/dL (ref 8.4–10.5)
Chloride: 100 mEq/L (ref 96–112)
Creatinine, Ser: 1.01 mg/dL (ref 0.40–1.20)
GFR: 70.66 mL/min (ref >60.00)
Glucose, Bld: 87 mg/dL (ref 70–99)
Potassium: 4.7 mEq/L (ref 3.5–5.1)
Sodium: 140 mEq/L (ref 135–145)

## 2020-03-02 LAB — LIPID PANEL
Cholesterol: 202 mg/dL — ABNORMAL HIGH (ref 0–200)
HDL: 97.5 mg/dL (ref >39.00)
LDL Cholesterol: 94 mg/dL (ref 0–99)
NonHDL: 104.08
Total CHOL/HDL Ratio: 2
Triglycerides: 50 mg/dL (ref 0.0–149.0)
VLDL: 10 mg/dL (ref 0.0–40.0)

## 2020-03-02 LAB — CBC WITH DIFFERENTIAL/PLATELET
Basophils Absolute: 0.1 10*3/uL (ref 0.0–0.1)
Basophils Relative: 3.2 % — ABNORMAL HIGH (ref 0.0–3.0)
Eosinophils Absolute: 0.1 10*3/uL (ref 0.0–0.7)
Eosinophils Relative: 3.5 % (ref 0.0–5.0)
HCT: 40.6 % (ref 36.0–46.0)
Hemoglobin: 13.7 g/dL (ref 12.0–15.0)
Lymphocytes Relative: 52.5 % — ABNORMAL HIGH (ref 12.0–46.0)
Lymphs Abs: 1.8 10*3/uL (ref 0.7–4.0)
MCHC: 33.7 g/dL (ref 30.0–36.0)
MCV: 98.8 fl (ref 78.0–100.0)
Monocytes Absolute: 0.3 10*3/uL (ref 0.1–1.0)
Monocytes Relative: 8 % (ref 3.0–12.0)
Neutro Abs: 1.1 10*3/uL — ABNORMAL LOW (ref 1.4–7.7)
Neutrophils Relative %: 32.8 % — ABNORMAL LOW (ref 43.0–77.0)
Platelets: 252 10*3/uL (ref 150.0–400.0)
RBC: 4.11 Mil/uL (ref 3.87–5.11)
RDW: 13.2 % (ref 11.5–15.5)
WBC: 3.4 10*3/uL — ABNORMAL LOW (ref 4.0–10.5)

## 2020-03-02 LAB — TSH: TSH: 0.85 u[IU]/mL (ref 0.35–4.50)

## 2020-03-02 NOTE — Patient Instructions (Signed)
Preventive Care 98-48 Years Old, Female Preventive care refers to visits with your health care provider and lifestyle choices that can promote health and wellness. This includes:  A yearly physical exam. This may also be called an annual well check.  Regular dental visits and eye exams.  Immunizations.  Screening for certain conditions.  Healthy lifestyle choices, such as eating a healthy diet, getting regular exercise, not using drugs or products that contain nicotine and tobacco, and limiting alcohol use. What can I expect for my preventive care visit? Physical exam Your health care provider will check your:  Height and weight. This may be used to calculate body mass index (BMI), which tells if you are at a healthy weight.  Heart rate and blood pressure.  Skin for abnormal spots. Counseling Your health care provider may ask you questions about your:  Alcohol, tobacco, and drug use.  Emotional well-being.  Home and relationship well-being.  Sexual activity.  Eating habits.  Work and work Statistician.  Method of birth control.  Menstrual cycle.  Pregnancy history. What immunizations do I need?  Influenza (flu) vaccine  This is recommended every year. Tetanus, diphtheria, and pertussis (Tdap) vaccine  You may need a Td booster every 10 years. Varicella (chickenpox) vaccine  You may need this if you have not been vaccinated. Zoster (shingles) vaccine  You may need this after age 45. Measles, mumps, and rubella (MMR) vaccine  You may need at least one dose of MMR if you were born in 1957 or later. You may also need a second dose. Pneumococcal conjugate (PCV13) vaccine  You may need this if you have certain conditions and were not previously vaccinated. Pneumococcal polysaccharide (PPSV23) vaccine  You may need one or two doses if you smoke cigarettes or if you have certain conditions. Meningococcal conjugate (MenACWY) vaccine  You may need this if you  have certain conditions. Hepatitis A vaccine  You may need this if you have certain conditions or if you travel or work in places where you may be exposed to hepatitis A. Hepatitis B vaccine  You may need this if you have certain conditions or if you travel or work in places where you may be exposed to hepatitis B. Haemophilus influenzae type b (Hib) vaccine  You may need this if you have certain conditions. Human papillomavirus (HPV) vaccine  If recommended by your health care provider, you may need three doses over 6 months. You may receive vaccines as individual doses or as more than one vaccine together in one shot (combination vaccines). Talk with your health care provider about the risks and benefits of combination vaccines. What tests do I need? Blood tests  Lipid and cholesterol levels. These may be checked every 5 years, or more frequently if you are over 22 years old.  Hepatitis C test.  Hepatitis B test. Screening  Lung cancer screening. You may have this screening every year starting at age 65 if you have a 30-pack-year history of smoking and currently smoke or have quit within the past 15 years.  Colorectal cancer screening. All adults should have this screening starting at age 41 and continuing until age 5. Your health care provider may recommend screening at age 14 if you are at increased risk. You will have tests every 1-10 years, depending on your results and the type of screening test.  Diabetes screening. This is done by checking your blood sugar (glucose) after you have not eaten for a while (fasting). You may have this  done every 1-3 years.  Mammogram. This may be done every 1-2 years. Talk with your health care provider about when you should start having regular mammograms. This may depend on whether you have a family history of breast cancer.  BRCA-related cancer screening. This may be done if you have a family history of breast, ovarian, tubal, or peritoneal  cancers.  Pelvic exam and Pap test. This may be done every 3 years starting at age 49. Starting at age 8, this may be done every 5 years if you have a Pap test in combination with an HPV test. Other tests  Sexually transmitted disease (STD) testing.  Bone density scan. This is done to screen for osteoporosis. You may have this scan if you are at high risk for osteoporosis. Follow these instructions at home: Eating and drinking  Eat a diet that includes fresh fruits and vegetables, whole grains, lean protein, and low-fat dairy.  Take vitamin and mineral supplements as recommended by your health care provider.  Do not drink alcohol if: ? Your health care provider tells you not to drink. ? You are pregnant, may be pregnant, or are planning to become pregnant.  If you drink alcohol: ? Limit how much you have to 0-1 drink a day. ? Be aware of how much alcohol is in your drink. In the [place] one drink equals one 12 oz bottle of beer (355 mL), one 5 oz glass of wine (148 mL), or one 1 oz glass of hard liquor (44 mL). Lifestyle  Take daily care of your teeth and gums.  Stay active. Exercise for at least 30 minutes on 5 or more days each week.  Do not use any products that contain nicotine or tobacco, such as cigarettes, e-cigarettes, and chewing tobacco. If you need help quitting, ask your health care provider.  If you are sexually active, practice safe sex. Use a condom or other form of birth control (contraception) in order to prevent pregnancy and STIs (sexually transmitted infections).  If told by your health care provider, take low-dose aspirin daily starting at age 58. What's next?  Visit your health care provider once a year for a well check visit.  Ask your health care provider how often you should have your eyes and teeth checked.  Stay up to date on all vaccines. This information is not intended to replace advice given to you by your health care provider. Make sure you  discuss any questions you have with your health care provider. Document Revised: 05/15/2018 Document Reviewed: 05/15/2018 Elsevier Patient Education  2020 [place] up screening mammogram  You should get a call from GI office regarding colonoscopy  We will call you regarding lab work done today

## 2020-03-02 NOTE — Progress Notes (Signed)
New Patient Office Visit  Subjective:  Patient ID: Jean English, female    DOB: 03-Oct-1970  Age: 49 y.o. MRN: [id number]  CC:  Chief Complaint  Patient presents with  . New Patient (Initial Visit)    pt would like blood work done today     HPI Jean English presents for establishing care and wellness visit.  Generally very healthy.  Past medical history significant for endometriosis diagnosis.  She had laparoscopy for that 1999.  She had 2 cesarean sections back in 2000 and 2005.  She has had previous hysterectomy for benign disease secondary to fibroids.  Has not had a physical in several years  Social history-she is divorced.  She has 70 year old son at [employer] who plays football as a Pharmacist, hospital and 32 year old daughter who is at [facility].  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  No history of smoking.  Only occasional alcohol use.  Family history-mother has had history of type 2 diabetes, hypertension, hyperlipidemia, kidney disease.  Father has hyperlipidemia and hypertension history.  She has 2 sisters.  One has had some type of colon polyps.  Maternal grandfather with lung cancer history  Past Medical History:  Diagnosis Date  . Anemia   . Arthritis    Right shoulder, collar area  . Endometriosis    laparoscopy  . Fibroid uterus   . Incontinence   . Shortness of breath dyspnea   . Urinary frequency     Past Surgical History:  Procedure Laterality Date  . ABDOMINAL HYSTERECTOMY  2016  . CESAREAN SECTION     X 2  . cortisone injection right shoulder    . HYSTEROSCOPY  10.10.12   W/REMOVAL OF POLYP  . Iron Infusion    . KNEE ARTHROSCOPY  1995   RIGHT   . PELVIC LAPAROSCOPY  1996   FOR ENDOMETRIOSIS  . TUBAL LIGATION  01/21/2004    Family History  Problem Relation Age of Onset  . Breast cancer Maternal Aunt 40       DECEASED  . Cancer Maternal Aunt        LUNG  . Cancer Maternal Uncle 54        LUNG  . Hypertension Mother   . Diabetes Mother   . Arthritis Mother   . Hyperlipidemia Mother   . Kidney disease Mother   . Hyperlipidemia Father   . Hypertension Father   . Diabetes Maternal Grandmother   . Early death Maternal Grandmother   . Stroke Maternal Grandmother   . Cancer Maternal Grandfather   . Early death Paternal Grandmother   . Stroke Paternal Grandmother   . Drug abuse Paternal Grandfather     Social History   Socioeconomic History  . Marital status: Married    Spouse name: Not on file  . Number of children: Not on file  . Years of education: Not on file  . Highest education level: Not on file  Occupational History  . Not on file  Tobacco Use  . Smoking status: Former Smoker    Packs/day: 0.10    Years: 0.50    Pack years: 0.05    Types: Cigarettes    Quit date: 01/16/2010    Years since quitting: 10.1  . Smokeless tobacco: Never Used  Substance and Sexual Activity  . Alcohol use: Yes    Alcohol/week: 0.0 standard drinks    Comment: OCCASIONALLY  . Drug use: No  .  Sexual activity: Not Currently    Birth control/protection: None, Surgical    Comment: TUBAL LIGATION- lilleta  Other Topics Concern  . Not on file  Social History Narrative  . Not on file   Social Determinants of Health   Financial Resource Strain:   . Difficulty of Paying Living Expenses:   Food Insecurity:   . Worried About Charity fundraiser in the Last Year:   . Arboriculturist in the Last Year:   Transportation Needs:   . Film/video editor (Medical):   Marland Kitchen Lack of Transportation (Non-Medical):   Physical Activity:   . Days of Exercise per Week:   . Minutes of Exercise per Session:   Stress:   . Feeling of Stress :   Social Connections:   . Frequency of Communication with Friends and Family:   . Frequency of Social Gatherings with Friends and Family:   . Attends Religious Services:   . Active Member of Clubs or Organizations:   . Attends Theatre manager Meetings:   Marland Kitchen Marital Status:   Intimate Partner Violence:   . Fear of Current or Ex-Partner:   . Emotionally Abused:   Marland Kitchen Physically Abused:   . Sexually Abused:     ROS Review of Systems  Constitutional: Negative for activity change, appetite change, fatigue, fever and unexpected weight change.  HENT: Negative for ear pain, hearing loss, sore throat and trouble swallowing.   Eyes: Negative for visual disturbance.  Respiratory: Negative for cough and shortness of breath.   Cardiovascular: Negative for chest pain and palpitations.  Gastrointestinal: Negative for abdominal pain, blood in stool, constipation and diarrhea.  Genitourinary: Negative for dysuria and hematuria.  Musculoskeletal: Negative for arthralgias, back pain and myalgias.  Skin: Negative for rash.  Neurological: Negative for dizziness, syncope and headaches.  Hematological: Negative for adenopathy.  Psychiatric/Behavioral: Negative for confusion and dysphoric mood.    Objective:   Today's Vitals: BP 120/68 (BP Location: Left Arm, Patient Position: Sitting, Cuff Size: Normal)   Pulse 61   Temp 97.6 F (36.4 C) (Temporal)   Ht 5\' 6"  (1.676 m)   Wt 148 lb 12.8 oz (67.5 kg)   LMP 12/02/2015   SpO2 97%   BMI 24.02 kg/m   Physical Exam Constitutional:      Appearance: She is well-developed.  HENT:     Head: Normocephalic and atraumatic.  Eyes:     Pupils: Pupils are equal, round, and reactive to light.  Neck:     Thyroid: No thyromegaly.  Cardiovascular:     Rate and Rhythm: Normal rate and regular rhythm.     Heart sounds: Normal heart sounds. No murmur heard.   Pulmonary:     Effort: No respiratory distress.     Breath sounds: Normal breath sounds. No wheezing or rales.  Abdominal:     General: Bowel sounds are normal. There is no distension.     Palpations: Abdomen is soft. There is no mass.     Tenderness: There is no abdominal tenderness. There is no guarding or rebound.   Musculoskeletal:        General: Normal range of motion.     Cervical back: Normal range of motion and neck supple.  Lymphadenopathy:     Cervical: No cervical adenopathy.  Skin:    Findings: No rash.  Neurological:     Mental Status: She is alert and oriented to person, place, and time.     Cranial Nerves: No  cranial nerve deficit.     Deep Tendon Reflexes: Reflexes normal.  Psychiatric:        Behavior: Behavior normal.        Thought Content: Thought content normal.        Judgment: Judgment normal.     Assessment & Plan:   Physical exam.  She has history of endometriosis and uterine fibroids with previous hysterectomy.  We discussed several health maintenance issues as below  -Recommend initial baseline screening labs.  Will include A1c with her family history of type 2 diabetes  -Strongly advised setting up initial screening mammogram  -Tdap given  -Consider setting up referral for colonoscopy given her family history of colon polyps  -Continue regular exercise habits  Outpatient Encounter Medications as of 03/02/2020  Medication Sig  . docusate sodium (COLACE) 100 MG capsule Take 1 capsule (100 mg total) by mouth 2 (two) times daily. (Patient not taking: Reported on 02/23/2016)  . ferrous sulfate 325 (65 FE) MG tablet Take 325 mg by mouth 2 (two) times daily with a meal. Reported on 02/23/2016 (Patient not taking: Reported on 03/02/2020)  . ibuprofen (ADVIL,MOTRIN) 200 MG tablet Take 600-800 mg by mouth every 6 (six) hours as needed for mild pain or moderate pain. Reported on 02/23/2016 (Patient not taking: Reported on 03/02/2020)  . metoCLOPramide (REGLAN) 10 MG tablet Take 1 tablet (10 mg total) by mouth 3 (three) times daily with meals. (Patient not taking: Reported on 01/10/2016)  . oxyCODONE-acetaminophen (PERCOCET) 5-325 MG tablet Take 1 tablet by mouth every 4 (four) hours as needed for severe pain. (Patient not taking: Reported on 01/10/2016)   No facility-administered  encounter medications on file as of 03/02/2020.    Follow-up: No follow-ups on file.   Carolann Littler, MD

## 2020-03-03 LAB — HEMOGLOBIN A1C: Hgb A1c MFr Bld: 5.2 % (ref 4.6–6.5)

## 2020-04-06 ENCOUNTER — Other Ambulatory Visit: Payer: Self-pay

## 2020-04-06 ENCOUNTER — Ambulatory Visit (AMBULATORY_SURGERY_CENTER): Payer: Self-pay

## 2020-04-06 VITALS — Ht 66.0 in | Wt 151.0 lb

## 2020-04-06 DIAGNOSIS — Z1211 Encounter for screening for malignant neoplasm of colon: Secondary | ICD-10-CM

## 2020-04-06 NOTE — Progress Notes (Signed)
No egg or soy allergy known to patient  No issues with past sedation with any surgeries or procedures no intubation problems in the past  No diet pills per patient No home 02 use per patient  No blood thinners per patient  Pt denies issues with constipation  No A fib or A flutter  EMMI video to MyChart and with pt COVID 19 guidelines implemented in [facility] today   COVID vaccine completed on 12/2019;  Due to the COVID-19 pandemic we are asking patients to follow these guidelines. Please only bring one care partner. Please be aware that your care partner may wait in the car in the parking lot or if they feel like they will be too hot to wait in the car, they may wait in the lobby on the 4th floor. All care partners are required to wear a mask the entire time (we do not have any that we can provide them), they need to practice social distancing, and we will do a Covid check for all patient's and care partners when you arrive. Also we will check their temperature and your temperature. If the care partner waits in their car they need to stay in the parking lot the entire time and we will call them on their cell phone when the patient is ready for discharge so they can bring the car to the front of the building. Also all patient's will need to wear a mask into building.

## 2020-04-07 ENCOUNTER — Encounter: Payer: Self-pay | Admitting: Gastroenterology

## 2020-04-20 ENCOUNTER — Other Ambulatory Visit: Payer: Self-pay

## 2020-04-20 ENCOUNTER — Encounter: Payer: Self-pay | Admitting: Gastroenterology

## 2020-04-20 ENCOUNTER — Ambulatory Visit (AMBULATORY_SURGERY_CENTER): Payer: No Typology Code available for payment source | Admitting: Gastroenterology

## 2020-04-20 VITALS — BP 106/67 | HR 54 | Temp 96.9°F | Resp 19 | Ht 66.0 in | Wt 151.0 lb

## 2020-04-20 DIAGNOSIS — Z1211 Encounter for screening for malignant neoplasm of colon: Secondary | ICD-10-CM | POA: Diagnosis present

## 2020-04-20 MED ORDER — SODIUM CHLORIDE 0.9 % IV SOLN: 500.0000 mL | Freq: Once | INTRAVENOUS | Status: DC

## 2020-04-20 NOTE — Patient Instructions (Signed)
YOU HAD AN ENDOSCOPIC PROCEDURE TODAY AT THE [facility]:   Refer to the procedure report that was given to you for any specific questions about what was found during the examination.  If the procedure report does not answer your questions, please call your gastroenterologist to clarify.  If you requested that your care partner not be given the details of your procedure findings, then the procedure report has been included in a sealed envelope for you to review at your convenience later.  YOU SHOULD EXPECT: Some feelings of bloating in the abdomen. Passage of more gas than usual.  Walking can help get rid of the air that was put into your GI tract during the procedure and reduce the bloating. If you had a lower endoscopy (such as a colonoscopy or flexible sigmoidoscopy) you may notice spotting of blood in your stool or on the toilet paper. If you underwent a bowel prep for your procedure, you may not have a normal bowel movement for a few days.  Please Note:  You might notice some irritation and congestion in your nose or some drainage.  This is from the oxygen used during your procedure.  There is no need for concern and it should clear up in a day or so.  SYMPTOMS TO REPORT IMMEDIATELY:   Following lower endoscopy (colonoscopy or flexible sigmoidoscopy):  Excessive amounts of blood in the stool  Significant tenderness or worsening of abdominal pains  Swelling of the abdomen that is new, acute  Fever of 100F or higher  For urgent or emergent issues, a gastroenterologist can be reached at any hour by calling [phone]. Do not use MyChart messaging for urgent concerns.    DIET:  We do recommend a small meal at first, but then you may proceed to your regular diet.  Drink plenty of fluids but you should avoid alcoholic beverages for 24 hours.  ACTIVITY:  You should plan to take it easy for the rest of today and you should NOT DRIVE or use heavy machinery until tomorrow (because  of the sedation medicines used during the test).    FOLLOW UP: Our staff will call the number listed on your records 48-72 hours following your procedure to check on you and address any questions or concerns that you may have regarding the information given to you following your procedure. If we do not reach you, we will leave a message.  We will attempt to reach you two times.  During this call, we will ask if you have developed any symptoms of COVID 19. If you develop any symptoms (ie: fever, flu-like symptoms, shortness of breath, cough etc.) before then, please call [phone].  If you test positive for Covid 19 in the 2 weeks post procedure, please call and report this information to us.    If any biopsies were taken you will be contacted by phone or by letter within the next 1-3 weeks.  Please call us at [phone] if you have not heard about the biopsies in 3 weeks.    SIGNATURES/CONFIDENTIALITY: You and/or your care partner have signed paperwork which will be entered into your electronic medical record.  These signatures attest to the fact that that the information above on your After Visit Summary has been reviewed and is understood.  Full responsibility of the confidentiality of this discharge information lies with you and/or your care-partner. 

## 2020-04-20 NOTE — Op Note (Signed)
Jean English Patient Name: Jean English Procedure Date: 04/20/2020 11:16 AM MRN: [id number] Endoscopist: Mallie Mussel L. Loletha Carrow , MD Age: 49 Referring MD:  Date of Birth: Apr 14, 1971 Gender: Female Account #: [id number] Procedure:                Colonoscopy Indications:              Screening for colorectal malignant neoplasm, This                            is the patient's first colonoscopy Medicines:                Monitored Anesthesia Care Procedure:                Pre-Anesthesia Assessment:                           - Prior to the procedure, a History and Physical                            was performed, and patient medications and                            allergies were reviewed. The patient's tolerance of                            previous anesthesia was also reviewed. The risks                            and benefits of the procedure and the sedation                            options and risks were discussed with the patient.                            All questions were answered, and informed consent                            was obtained. Prior Anticoagulants: The patient has                            taken no previous anticoagulant or antiplatelet                            agents. ASA Grade Assessment: II - A patient with                            mild systemic disease. After reviewing the risks                            and benefits, the patient was deemed in                            satisfactory condition to undergo the procedure.  After obtaining informed consent, the colonoscope                            was passed under direct vision. Throughout the                            procedure, the patient's blood pressure, pulse, and                            oxygen saturations were monitored continuously. The                            Colonoscope was introduced through the anus and                            advanced to the the  cecum, identified by                            appendiceal orifice and ileocecal valve. The                            colonoscopy was performed with difficulty due to a                            redundant colon and significant looping. Successful                            completion of the procedure was aided by changing                            the patient to a supine position and using manual                            pressure. The patient tolerated the procedure well.                            The quality of the bowel preparation was good. The                            ileocecal valve, appendiceal orifice, and rectum                            were photographed. Scope In: 11:37:21 AM Scope Out: 11:58:20 AM Scope Withdrawal Time: 0 hours 8 minutes 38 seconds  Total Procedure Duration: 0 hours 20 minutes 59 seconds  Findings:                 The perianal and digital rectal examinations were                            normal.                           The colon (entire examined portion) was  significantly redundant causing scope looping.                           There is no endoscopic evidence of polyps in the                            entire colon.                           The exam was otherwise without abnormality on                            direct and retroflexion views. Complications:            No immediate complications. Estimated Blood Loss:     Estimated blood loss: none. Impression:               - Redundant colon.                           - The examination was otherwise normal on direct                            and retroflexion views.                           - No specimens collected. Recommendation:           - Patient has a contact number available for                            emergencies. The signs and symptoms of potential                            delayed complications were discussed with the                             patient. Return to normal activities tomorrow.                            Written discharge instructions were provided to the                            patient.                           - Resume previous diet.                           - Continue present medications.                           - Repeat colonoscopy in 10 years for screening                            purposes. Jerusha Reising L. Loletha Carrow, MD 04/20/2020 12:03:37 PM This report has been signed electronically.

## 2020-04-20 NOTE — Progress Notes (Signed)
VS-WR  Pt's states no medical or surgical changes since previsit or office visit.

## 2020-04-20 NOTE — Progress Notes (Signed)
pt tolerated well. VSS. awake and to recovery. Report given to RN.  

## 2020-04-22 ENCOUNTER — Telehealth: Payer: Self-pay | Admitting: *Deleted

## 2020-04-22 NOTE — Telephone Encounter (Signed)
°  Follow up Call-  Call back number 04/20/2020  Post procedure Call Back phone  # [phone]  Permission to leave phone message Yes  Some recent data might be hidden     Patient questions:  Do you have a fever, pain , or abdominal swelling? No. Pain Score  0 *  Have you tolerated food without any problems? Yes.    Have you been able to return to your normal activities? Yes.    Do you have any questions about your discharge instructions: Diet   No. Medications  No. Follow up visit  No.  Do you have questions or concerns about your Care? No.  Actions: * If pain score is 4 or above: No action needed, pain <4.  1. Have you developed a fever since your procedure? no  2.   Have you had an respiratory symptoms (SOB or cough) since your procedure? no  3.   Have you tested positive for COVID 19 since your procedure no  4.   Have you had any family members/close contacts diagnosed with the COVID 19 since your procedure?  no   If yes to any of these questions please route to Joylene John, RN and Erenest Rasher, RN

## 2020-04-22 NOTE — Telephone Encounter (Signed)
°  Follow up Call-  Call back number 04/20/2020  Post procedure Call Back phone  # [phone]  Permission to leave phone message Yes  Some recent data might be hidden     Patient questions:  Phone picked up but no answer.

## 2021-04-10 ENCOUNTER — Encounter: Payer: Self-pay | Admitting: Family Medicine

## 2021-04-10 ENCOUNTER — Ambulatory Visit (INDEPENDENT_AMBULATORY_CARE_PROVIDER_SITE_OTHER): Payer: No Typology Code available for payment source | Admitting: Family Medicine

## 2021-04-10 ENCOUNTER — Other Ambulatory Visit: Payer: Self-pay

## 2021-04-10 VITALS — BP 130/80 | HR 91 | Temp 98.7°F | Ht 66.0 in | Wt 159.6 lb

## 2021-04-10 DIAGNOSIS — Z0000 Encounter for general adult medical examination without abnormal findings: Secondary | ICD-10-CM

## 2021-04-10 LAB — CBC WITH DIFFERENTIAL/PLATELET
Basophils Absolute: 0 10*3/uL (ref 0.0–0.1)
Basophils Relative: 1.2 % (ref 0.0–3.0)
Eosinophils Absolute: 0.1 10*3/uL (ref 0.0–0.7)
Eosinophils Relative: 3.7 % (ref 0.0–5.0)
HCT: 42.3 % (ref 36.0–46.0)
Hemoglobin: 14.2 g/dL (ref 12.0–15.0)
Lymphocytes Relative: 46.4 % — ABNORMAL HIGH (ref 12.0–46.0)
Lymphs Abs: 1.8 10*3/uL (ref 0.7–4.0)
MCHC: 33.5 g/dL (ref 30.0–36.0)
MCV: 100.2 fl — ABNORMAL HIGH (ref 78.0–100.0)
Monocytes Absolute: 0.3 10*3/uL (ref 0.1–1.0)
Monocytes Relative: 8.6 % (ref 3.0–12.0)
Neutro Abs: 1.5 10*3/uL (ref 1.4–7.7)
Neutrophils Relative %: 40.1 % — ABNORMAL LOW (ref 43.0–77.0)
Platelets: 256 10*3/uL (ref 150.0–400.0)
RBC: 4.22 Mil/uL (ref 3.87–5.11)
RDW: 12.9 % (ref 11.5–15.5)
WBC: 3.8 10*3/uL — ABNORMAL LOW (ref 4.0–10.5)

## 2021-04-10 LAB — HEPATIC FUNCTION PANEL
ALT: 13 U/L (ref 0–35)
AST: 20 U/L (ref 0–37)
Albumin: 4.5 g/dL (ref 3.5–5.2)
Alkaline Phosphatase: 44 U/L (ref 39–117)
Bilirubin, Direct: 0.2 mg/dL (ref 0.0–0.3)
Total Bilirubin: 1.5 mg/dL — ABNORMAL HIGH (ref 0.2–1.2)
Total Protein: 7 g/dL (ref 6.0–8.3)

## 2021-04-10 LAB — BASIC METABOLIC PANEL
BUN: 20 mg/dL (ref 6–23)
CO2: 25 mEq/L (ref 19–32)
Calcium: 9.8 mg/dL (ref 8.4–10.5)
Chloride: 103 mEq/L (ref 96–112)
Creatinine, Ser: 1.03 mg/dL (ref 0.40–1.20)
GFR: 63.83 mL/min (ref >60.00)
Glucose, Bld: 83 mg/dL (ref 70–99)
Potassium: 4.5 mEq/L (ref 3.5–5.1)
Sodium: 139 mEq/L (ref 135–145)

## 2021-04-10 LAB — LIPID PANEL
Cholesterol: 217 mg/dL — ABNORMAL HIGH (ref 0–200)
HDL: 98.9 mg/dL (ref >39.00)
LDL Cholesterol: 104 mg/dL — ABNORMAL HIGH (ref 0–99)
NonHDL: 117.72
Total CHOL/HDL Ratio: 2
Triglycerides: 71 mg/dL (ref 0.0–149.0)
VLDL: 14.2 mg/dL (ref 0.0–40.0)

## 2021-04-10 LAB — HEMOGLOBIN A1C: Hgb A1c MFr Bld: 5.4 % (ref 4.6–6.5)

## 2021-04-10 LAB — TSH: TSH: 0.77 u[IU]/mL (ref 0.35–5.50)

## 2021-04-10 NOTE — Progress Notes (Signed)
Established Patient Office Visit  Subjective:  Patient ID: Jean English, female    DOB: 1971/01/15  Age: 50 y.o. MRN: [id number]  CC:  Chief Complaint  Patient presents with   Annual Exam    HPI Jean English presents for physical exam.  She got colonoscopy last year which came back normal with no polyps.  She still has not had mammogram ever.  Previous hysterectomy for benign disease so she does not get Pap smears.  Still exercising daily.  Past history of endometriosis.  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  She is divorced.  Non-smoker.  Rare alcohol use.  Son is a Pharmacist, hospital at [employer] and entering his senior year.  Daughter just graduated [employer] and works in [place].  Family history Mom has end-stage renal disease and recently went on hemodialysis.  Her mother has history of type 2 diabetes and hypertension hyperlipidemia.  Father has hypertension hyperlipidemia.  2 sisters that are generally well.  1 has had some type of polyp.  Past Medical History:  Diagnosis Date   Anemia    hx=2015-2017   Arthritis    Right shoulder, collar area   Endometriosis    laparoscopy   Fibroid uterus 1998   Incontinence    Shortness of breath dyspnea    Urinary frequency     Past Surgical History:  Procedure Laterality Date   ABDOMINAL HYSTERECTOMY  2016   CESAREAN SECTION     X 2   cortisone injection right shoulder     HYSTEROSCOPY  10.10.12   W/REMOVAL OF POLYP   Iron Infusion     KNEE ARTHROSCOPY  1995   RIGHT    PELVIC LAPAROSCOPY  1996   FOR ENDOMETRIOSIS   TUBAL LIGATION  01/21/2004    Family History  Problem Relation Age of Onset   Hypertension Mother    Diabetes Mother    Arthritis Mother    Hyperlipidemia Mother    Kidney disease Mother    Colon polyps Mother 26   Hyperlipidemia Father    Hypertension Father    Hypertension Sister    Colon polyps Sister 76   Breast cancer Maternal Aunt 25        DECEASED   Cancer Maternal Aunt        LUNG   Cancer Maternal Uncle 46       LUNG   Diabetes Maternal Grandmother    Early death Maternal Grandmother    Stroke Maternal Grandmother    Cancer Maternal Grandfather    Early death Paternal Grandmother    Stroke Paternal Grandmother    Drug abuse Paternal Grandfather    Colon cancer Neg Hx    Esophageal cancer Neg Hx    Rectal cancer Neg Hx    Stomach cancer Neg Hx     Social History   Socioeconomic History   Marital status: Divorced    Spouse name: Not on file   Number of children: Not on file   Years of education: Not on file   Highest education level: Not on file  Occupational History   Not on file  Tobacco Use   Smoking status: Former    Packs/day: 0.10    Years: 0.50    Pack years: 0.05    Types: Cigarettes    Quit date: 01/16/2010    Years since quitting: 11.2   Smokeless tobacco: Never  Vaping Use   Vaping Use: Never used  Substance and  Sexual Activity   Alcohol use: Yes    Alcohol/week: 0.0 standard drinks    Comment: OCCASIONALLY   Drug use: No   Sexual activity: Not Currently    Birth control/protection: None, Surgical    Comment: TUBAL LIGATION- lilleta  Other Topics Concern   Not on file  Social History Narrative   Not on file   Social Determinants of Health   Financial Resource Strain: Not on file  Food Insecurity: Not on file  Transportation Needs: Not on file  Physical Activity: Not on file  Stress: Not on file  Social Connections: Not on file  Intimate Partner Violence: Not on file    Outpatient Medications Prior to Visit  Medication Sig Dispense Refill   ibuprofen (ADVIL,MOTRIN) 200 MG tablet Take 600-800 mg by mouth every 6 (six) hours as needed for mild pain or moderate pain. Reported on 02/23/2016     No facility-administered medications prior to visit.    No Known Allergies  ROS Review of Systems  Constitutional:  Negative for activity change, appetite change, fatigue, fever  and unexpected weight change.  HENT:  Negative for ear pain, hearing loss, sore throat and trouble swallowing.   Eyes:  Negative for visual disturbance.  Respiratory:  Negative for cough and shortness of breath.   Cardiovascular:  Negative for chest pain and palpitations.  Gastrointestinal:  Negative for abdominal pain, blood in stool, constipation and diarrhea.  Genitourinary:  Negative for dysuria and hematuria.  Musculoskeletal:  Negative for arthralgias, back pain and myalgias.  Skin:  Negative for rash.  Neurological:  Negative for dizziness, syncope and headaches.  Hematological:  Negative for adenopathy.  Psychiatric/Behavioral:  Negative for confusion and dysphoric mood.      Objective:    Physical Exam Constitutional:      Appearance: She is well-developed.  HENT:     Head: Normocephalic and atraumatic.  Eyes:     Pupils: Pupils are equal, round, and reactive to light.  Neck:     Thyroid: No thyromegaly.  Cardiovascular:     Rate and Rhythm: Normal rate and regular rhythm.     Heart sounds: Normal heart sounds. No murmur heard. Pulmonary:     Effort: No respiratory distress.     Breath sounds: Normal breath sounds. No wheezing or rales.     Comments: Breasts are symmetric with no mass.  No nipple inversion.  No skin dimpling.   Abdominal:     General: Bowel sounds are normal. There is no distension.     Palpations: Abdomen is soft. There is no mass.     Tenderness: There is no abdominal tenderness. There is no guarding or rebound.  Musculoskeletal:        General: Normal range of motion.     Cervical back: Normal range of motion and neck supple.  Lymphadenopathy:     Cervical: No cervical adenopathy.  Skin:    Findings: No rash.  Neurological:     Mental Status: She is alert and oriented to person, place, and time.     Cranial Nerves: No cranial nerve deficit.     Deep Tendon Reflexes: Reflexes normal.  Psychiatric:        Behavior: Behavior normal.         Thought Content: Thought content normal.        Judgment: Judgment normal.    BP 130/80   Pulse 91   Temp 98.7 F (37.1 C) (Oral)   Ht '5\' 6"'  (1.676 m)  Wt 159 lb 9.6 oz (72.4 kg)   LMP 12/02/2015   SpO2 97%   BMI 25.76 kg/m  Wt Readings from Last 3 Encounters:  04/10/21 159 lb 9.6 oz (72.4 kg)  04/20/20 151 lb (68.5 kg)  04/06/20 151 lb (68.5 kg)     There are no preventive care reminders to display for this patient.  There are no preventive care reminders to display for this patient.  Lab Results  Component Value Date   TSH 0.85 03/02/2020   Lab Results  Component Value Date   WBC 3.4 (L) 03/02/2020   HGB 13.7 03/02/2020   HCT 40.6 03/02/2020   MCV 98.8 03/02/2020   PLT 252.0 03/02/2020   Lab Results  Component Value Date   NA 140 03/02/2020   K 4.7 03/02/2020   CHLORIDE 109 01/04/2016   CO2 30 03/02/2020   GLUCOSE 87 03/02/2020   BUN 15 03/02/2020   CREATININE 1.01 03/02/2020   BILITOT 1.0 03/02/2020   ALKPHOS 46 03/02/2020   AST 28 03/02/2020   ALT 18 03/02/2020   PROT 6.7 03/02/2020   ALBUMIN 4.4 03/02/2020   CALCIUM 9.3 03/02/2020   ANIONGAP 10 01/04/2016   EGFR 81 (L) 01/04/2016   GFR 70.66 03/02/2020   Lab Results  Component Value Date   CHOL 202 (H) 03/02/2020   Lab Results  Component Value Date   HDL 97.50 03/02/2020   Lab Results  Component Value Date   LDLCALC 94 03/02/2020   Lab Results  Component Value Date   TRIG 50.0 03/02/2020   Lab Results  Component Value Date   CHOLHDL 2 03/02/2020   Lab Results  Component Value Date   HGBA1C 5.2 03/02/2020      Assessment & Plan:   Problem List Items Addressed This Visit   None Visit Diagnoses     Physical exam    -  Primary   Relevant Orders   Basic metabolic panel   Lipid panel   CBC with Differential/Platelet   TSH   Hepatic function panel   Hemoglobin A1c     Generally healthy 50 year old female.  We discussed the following health maintenance  issues  -Continue regular exercise habits -Consider Shingrix at age 72 -Strongly encouraged to set up screening mammogram -Colonoscopy up-to-date -Continue annual flu vaccine -Labs as above including A1c with her strong family history of type 2 diabetes  No orders of the defined types were placed in this encounter.   Follow-up: No follow-ups on file.    Carolann Littler, MD

## 2021-04-10 NOTE — Progress Notes (Signed)
Pre visit review using our clinic review tool, if applicable. No additional management support is needed unless otherwise documented below in the visit note. 

## 2021-04-10 NOTE — Patient Instructions (Signed)
Set up baseline mammogram.

## 2021-05-16 ENCOUNTER — Ambulatory Visit: Payer: No Typology Code available for payment source | Admitting: Family Medicine

## 2021-05-18 ENCOUNTER — Other Ambulatory Visit: Payer: Self-pay

## 2021-05-19 ENCOUNTER — Ambulatory Visit: Payer: No Typology Code available for payment source | Admitting: Family Medicine

## 2021-05-19 ENCOUNTER — Encounter: Payer: Self-pay | Admitting: Family Medicine

## 2021-05-19 VITALS — BP 120/70 | HR 77 | Temp 98.0°F | Ht 66.0 in | Wt 160.1 lb

## 2021-05-19 DIAGNOSIS — T148XXA Other injury of unspecified body region, initial encounter: Secondary | ICD-10-CM

## 2021-05-19 DIAGNOSIS — S5012XA Contusion of left forearm, initial encounter: Secondary | ICD-10-CM | POA: Diagnosis not present

## 2021-05-19 NOTE — Progress Notes (Signed)
Established Patient Office Visit  Subjective:  Patient ID: Jean English, female    DOB: 02-Mar-1971  Age: 50 y.o. MRN: [id number]  CC:  Chief Complaint  Patient presents with   discolored spot on arm    Showed up on Saturday night, started as a knot. Turned blue/purple the next day. Has used ice, no known injury. The knot has gone down, but the skin is still discolored.     HPI Jean English presents for some discoloration and a "knot "left forearm.  She states on Saturday night she was feeling chilled and was rubbing across her arms and felt firmness left forearm.  When she looked down she saw some discoloration.  Nonpainful.  Denies any trauma.  She does do a lot of lifting with upper body and she first noticed this shortly after working out.  Has not noted any generalized bruising or any other evidence of bleeding such as bleeding from the gums, hematuria, etc. no aspirin use.  Past Medical History:  Diagnosis Date   Anemia    hx=2015-2017   Arthritis    Right shoulder, collar area   Endometriosis    laparoscopy   Fibroid uterus 1998   Incontinence    Shortness of breath dyspnea    Urinary frequency     Past Surgical History:  Procedure Laterality Date   ABDOMINAL HYSTERECTOMY  2016   CESAREAN SECTION     X 2   cortisone injection right shoulder     HYSTEROSCOPY  10.10.12   W/REMOVAL OF POLYP   Iron Infusion     KNEE ARTHROSCOPY  1995   RIGHT    PELVIC LAPAROSCOPY  1996   FOR ENDOMETRIOSIS   TUBAL LIGATION  01/21/2004    Family History  Problem Relation Age of Onset   Hypertension Mother    Diabetes Mother    Arthritis Mother    Hyperlipidemia Mother    Kidney disease Mother    Colon polyps Mother 42   Hyperlipidemia Father    Hypertension Father    Hypertension Sister    Colon polyps Sister 33   Breast cancer Maternal Aunt 66       DECEASED   Cancer Maternal Aunt        LUNG   Cancer Maternal Uncle 24       LUNG   Diabetes  Maternal Grandmother    Early death Maternal Grandmother    Stroke Maternal Grandmother    Cancer Maternal Grandfather    Early death Paternal Grandmother    Stroke Paternal Grandmother    Drug abuse Paternal Grandfather    Colon cancer Neg Hx    Esophageal cancer Neg Hx    Rectal cancer Neg Hx    Stomach cancer Neg Hx     Social History   Socioeconomic History   Marital status: Divorced    Spouse name: Not on file   Number of children: Not on file   Years of education: Not on file   Highest education level: Not on file  Occupational History   Not on file  Tobacco Use   Smoking status: Former    Packs/day: 0.10    Years: 0.50    Pack years: 0.05    Types: Cigarettes    Quit date: 01/16/2010    Years since quitting: 11.3   Smokeless tobacco: Never  Vaping Use   Vaping Use: Never used  Substance and Sexual Activity   Alcohol use: Yes  Alcohol/week: 0.0 standard drinks    Comment: OCCASIONALLY   Drug use: No   Sexual activity: Not Currently    Birth control/protection: None, Surgical    Comment: TUBAL LIGATION- lilleta  Other Topics Concern   Not on file  Social History Narrative   Not on file   Social Determinants of Health   Financial Resource Strain: Not on file  Food Insecurity: Not on file  Transportation Needs: Not on file  Physical Activity: Not on file  Stress: Not on file  Social Connections: Not on file  Intimate Partner Violence: Not on file    Outpatient Medications Prior to Visit  Medication Sig Dispense Refill   ibuprofen (ADVIL,MOTRIN) 200 MG tablet Take 600-800 mg by mouth every 6 (six) hours as needed for mild pain or moderate pain. Reported on 02/23/2016     No facility-administered medications prior to visit.    No Known Allergies  ROS Review of Systems  Constitutional:  Negative for appetite change, chills, fever and unexpected weight change.     Objective:    Physical Exam Vitals reviewed.  Constitutional:      Appearance:  Normal appearance.  Cardiovascular:     Rate and Rhythm: Normal rate and regular rhythm.  Pulmonary:     Effort: Pulmonary effort is normal.     Breath sounds: Normal breath sounds.  Skin:    Comments: Left forearm reveals approximately 3 to 4 cm area of ecchymosis near the center she has small hematoma.  Nontender.  Nonfluctuant.  No warmth.  Neurological:     Mental Status: She is alert.    BP 120/70   Pulse 77   Temp 98 F (36.7 C) (Oral)   Ht '5\' 6"'  (1.676 m)   Wt 160 lb 2 oz (72.6 kg)   LMP 12/02/2015   SpO2 97%   BMI 25.84 kg/m  Wt Readings from Last 3 Encounters:  05/19/21 160 lb 2 oz (72.6 kg)  04/10/21 159 lb 9.6 oz (72.4 kg)  04/20/20 151 lb (68.5 kg)     Health Maintenance Due  Topic Date Due   INFLUENZA VACCINE  04/17/2021    There are no preventive care reminders to display for this patient.  Lab Results  Component Value Date   TSH 0.77 04/10/2021   Lab Results  Component Value Date   WBC 3.8 (L) 04/10/2021   HGB 14.2 04/10/2021   HCT 42.3 04/10/2021   MCV 100.2 (H) 04/10/2021   PLT 256.0 04/10/2021   Lab Results  Component Value Date   NA 139 04/10/2021   K 4.5 04/10/2021   CHLORIDE 109 01/04/2016   CO2 25 04/10/2021   GLUCOSE 83 04/10/2021   BUN 20 04/10/2021   CREATININE 1.03 04/10/2021   BILITOT 1.5 (H) 04/10/2021   ALKPHOS 44 04/10/2021   AST 20 04/10/2021   ALT 13 04/10/2021   PROT 7.0 04/10/2021   ALBUMIN 4.5 04/10/2021   CALCIUM 9.8 04/10/2021   ANIONGAP 10 01/04/2016   EGFR 81 (L) 01/04/2016   GFR 63.83 04/10/2021   Lab Results  Component Value Date   CHOL 217 (H) 04/10/2021   Lab Results  Component Value Date   HDL 98.90 04/10/2021   Lab Results  Component Value Date   LDLCALC 104 (H) 04/10/2021   Lab Results  Component Value Date   TRIG 71.0 04/10/2021   Lab Results  Component Value Date   CHOLHDL 2 04/10/2021   Lab Results  Component Value Date  HGBA1C 5.4 04/10/2021      Assessment & Plan:    Problem List Items Addressed This Visit   None Visit Diagnoses     Hematoma    -  Primary     Patient has small hematoma left forearm.  No generalized bruising.  She is not aware of specific injury.  Reassurance given.  She will try some heat to speed up resolution of the hematoma.  Follow-up for any recurrent spontaneous ecchymosis or other concerns  No orders of the defined types were placed in this encounter.   Follow-up: No follow-ups on file.    Carolann Littler, MD

## 2022-11-27 ENCOUNTER — Ambulatory Visit (INDEPENDENT_AMBULATORY_CARE_PROVIDER_SITE_OTHER): Payer: No Typology Code available for payment source | Admitting: Family Medicine

## 2022-11-27 VITALS — BP 138/88 | HR 90 | Temp 98.2°F | Ht 65.35 in | Wt 181.1 lb

## 2022-11-27 DIAGNOSIS — Z0000 Encounter for general adult medical examination without abnormal findings: Secondary | ICD-10-CM | POA: Diagnosis not present

## 2022-11-27 DIAGNOSIS — I10 Essential (primary) hypertension: Secondary | ICD-10-CM | POA: Diagnosis not present

## 2022-11-27 DIAGNOSIS — Z833 Family history of diabetes mellitus: Secondary | ICD-10-CM | POA: Diagnosis not present

## 2022-11-27 LAB — HEPATIC FUNCTION PANEL
ALT: 19 U/L (ref 0–35)
AST: 23 U/L (ref 0–37)
Albumin: 4.2 g/dL (ref 3.5–5.2)
Alkaline Phosphatase: 60 U/L (ref 39–117)
Bilirubin, Direct: 0.1 mg/dL (ref 0.0–0.3)
Total Bilirubin: 0.9 mg/dL (ref 0.2–1.2)
Total Protein: 7.5 g/dL (ref 6.0–8.3)

## 2022-11-27 LAB — LIPID PANEL
Cholesterol: 257 mg/dL — ABNORMAL HIGH (ref 0–200)
HDL: 114.1 mg/dL (ref >39.00)
LDL Cholesterol: 122 mg/dL — ABNORMAL HIGH (ref 0–99)
NonHDL: 143.13
Total CHOL/HDL Ratio: 2
Triglycerides: 105 mg/dL (ref 0.0–149.0)
VLDL: 21 mg/dL (ref 0.0–40.0)

## 2022-11-27 LAB — BASIC METABOLIC PANEL
BUN: 13 mg/dL (ref 6–23)
CO2: 26 mEq/L (ref 19–32)
Calcium: 9.9 mg/dL (ref 8.4–10.5)
Chloride: 98 mEq/L (ref 96–112)
Creatinine, Ser: 1.04 mg/dL (ref 0.40–1.20)
GFR: 62.38 mL/min (ref >60.00)
Glucose, Bld: 99 mg/dL (ref 70–99)
Potassium: 4.3 mEq/L (ref 3.5–5.1)
Sodium: 135 mEq/L (ref 135–145)

## 2022-11-27 LAB — CBC WITH DIFFERENTIAL/PLATELET
Basophils Absolute: 0 10*3/uL (ref 0.0–0.1)
Basophils Relative: 0.6 % (ref 0.0–3.0)
Eosinophils Absolute: 0.3 10*3/uL (ref 0.0–0.7)
Eosinophils Relative: 3.6 % (ref 0.0–5.0)
HCT: 41.6 % (ref 36.0–46.0)
Hemoglobin: 14.1 g/dL (ref 12.0–15.0)
Lymphocytes Relative: 25.2 % (ref 12.0–46.0)
Lymphs Abs: 2.1 10*3/uL (ref 0.7–4.0)
MCHC: 33.8 g/dL (ref 30.0–36.0)
MCV: 97.5 fl (ref 78.0–100.0)
Monocytes Absolute: 0.9 10*3/uL (ref 0.1–1.0)
Monocytes Relative: 10.5 % (ref 3.0–12.0)
Neutro Abs: 5 10*3/uL (ref 1.4–7.7)
Neutrophils Relative %: 60.1 % (ref 43.0–77.0)
Platelets: 267 10*3/uL (ref 150.0–400.0)
RBC: 4.27 Mil/uL (ref 3.87–5.11)
RDW: 14.2 % (ref 11.5–15.5)
WBC: 8.3 10*3/uL (ref 4.0–10.5)

## 2022-11-27 LAB — TSH: TSH: 1.13 u[IU]/mL (ref 0.35–5.50)

## 2022-11-27 LAB — HEMOGLOBIN A1C: Hgb A1c MFr Bld: 5.4 % (ref 4.6–6.5)

## 2022-11-27 MED ORDER — AMLODIPINE BESYLATE 5 MG PO TABS: 5.0000 mg | ORAL_TABLET | Freq: Every day | ORAL | 3 refills | Status: DC

## 2022-11-27 NOTE — Patient Instructions (Signed)
Set up initial screening mammogram.

## 2022-11-27 NOTE — Progress Notes (Signed)
Established Patient Office Visit  Subjective   Patient ID: Jean English, female    DOB: [birth year]  Age: 52 y.o. MRN: [id number]  Chief Complaint  Patient presents with   Annual Exam    HPI   Jean English is seen for physical exam.  She is generally very healthy and takes no regular medications.  She has gained a fair amount of weight this year and she seems somewhat surprised.  She states she still exercising at the gym 5 days/week and trying to be conscious regarding dietary intake.  Was eating out a lot until around December. She is concerned about her blood pressure and apparently had a couple other mild elevations.  Strong family history of hypertension.  Health maintenance reviewed  -Has never had mammogram but is willing to get baseline initial mammogram -No history of shingles vaccine -Tetanus due 2031 -colonoscopy due 2031 -Previous hysterectomy for fibroids.  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  She is divorced.  She has daughter who is in nursing school at [employer] and a son who is attending college there as well.  Rare alcohol.  Non-smoker.  Exercises 5 days/week  Family history Mom has end-stage renal disease and recently went on hemodialysis. Her mother has history of type 2 diabetes and hypertension hyperlipidemia. Father has hypertension hyperlipidemia. 2 sisters that are generally well. 1 has had some type of polyp.   Past Medical History:  Diagnosis Date   Anemia    hx=2015-2017   Arthritis    Right shoulder, collar area   Endometriosis    laparoscopy   Fibroid uterus 1998   Incontinence    Shortness of breath dyspnea    Urinary frequency    Past Surgical History:  Procedure Laterality Date   ABDOMINAL HYSTERECTOMY  2016   CESAREAN SECTION     X 2   cortisone injection right shoulder     HYSTEROSCOPY  10.10.12   W/REMOVAL OF POLYP   Iron Infusion      KNEE ARTHROSCOPY  1995   RIGHT    PELVIC LAPAROSCOPY  1996   FOR ENDOMETRIOSIS   TUBAL LIGATION  01/21/2004    reports that she quit smoking about 12 years ago. Her smoking use included cigarettes. She has a 0.05 pack-year smoking history. She has never used smokeless tobacco. She reports current alcohol use. She reports that she does not use drugs. family history includes Arthritis in her mother; Breast cancer (age of onset: 78) in her maternal aunt; Cancer in her maternal aunt and maternal grandfather; Cancer (age of onset: 72) in her maternal uncle; Colon polyps (age of onset: 52) in her mother; Colon polyps (age of onset: 2) in her sister; Diabetes in her maternal grandmother and mother; Drug abuse in her paternal grandfather; Early death in her maternal grandmother and paternal grandmother; Hyperlipidemia in her father and mother; Hypertension in her father, mother, and sister; Kidney disease in her mother; Stroke in her maternal grandmother and paternal grandmother. No Known Allergies     Review of Systems  Constitutional:  Negative for chills, fever, malaise/fatigue and weight loss.  HENT:  Negative for hearing loss.   Eyes:  Negative for blurred vision and double vision.  Respiratory:  Negative for cough and shortness of breath.   Cardiovascular:  Negative for chest pain, palpitations and leg swelling.  Gastrointestinal:  Negative for abdominal pain, blood in stool, constipation and diarrhea.  Genitourinary:  Negative for dysuria.  Skin:  Negative for rash.  Neurological:  Negative for dizziness, speech change, seizures, loss of consciousness and headaches.  Psychiatric/Behavioral:  Negative for depression.       Objective:     BP 138/88 (BP Location: Left Arm, Cuff Size: Normal)   Pulse 90   Temp 98.2 F (36.8 C) (Oral)   Ht 5' 5.35" (1.66 m)   Wt 181 lb 1.6 oz (82.1 kg)   LMP 12/02/2015   SpO2 98%   BMI 29.81 kg/m  BP Readings from Last 3 Encounters:  11/27/22 138/88   05/19/21 120/70  04/10/21 130/80   Wt Readings from Last 3 Encounters:  11/27/22 181 lb 1.6 oz (82.1 kg)  05/19/21 160 lb 2 oz (72.6 kg)  04/10/21 159 lb 9.6 oz (72.4 kg)      Physical Exam Vitals reviewed.  Constitutional:      General: She is not in acute distress.    Appearance: Normal appearance. She is well-developed.  HENT:     Head: Normocephalic and atraumatic.  Eyes:     Pupils: Pupils are equal, round, and reactive to light.  Neck:     Thyroid: No thyromegaly.  Cardiovascular:     Rate and Rhythm: Normal rate and regular rhythm.     Heart sounds: Normal heart sounds. No murmur heard. Pulmonary:     Effort: No respiratory distress.     Breath sounds: Normal breath sounds. No wheezing or rales.  Abdominal:     General: Bowel sounds are normal. There is no distension.     Palpations: Abdomen is soft. There is no mass.     Tenderness: There is no abdominal tenderness. There is no guarding or rebound.  Musculoskeletal:        General: Normal range of motion.     Cervical back: Normal range of motion and neck supple.  Lymphadenopathy:     Cervical: No cervical adenopathy.  Skin:    Findings: No rash.  Neurological:     Mental Status: She is alert and oriented to person, place, and time.     Cranial Nerves: No cranial nerve deficit.  Psychiatric:        Behavior: Behavior normal.        Thought Content: Thought content normal.        Judgment: Judgment normal.      No results found for any visits on 11/27/22.    The 10-year ASCVD risk score (Arnett DK, et al., 2019) is: 1.4%    Assessment & Plan:   #1 physical exam.  We discussed several health maintenance issues as below  -Recommend initial mammogram and she was given number and information to set this up. -No need for further Pap smears with history of hysterectomy for benign disease -Discussed Shingrix vaccine and she will consider.  She will check on insurance coverage -Colonoscopy  up-to-date -Obtain follow-up labs.  She is requesting A1c.  Will also check thyroid with her recent weight gain issues.  #2 hypertension currently untreated.  We discussed nonpharmacologic management with weight loss, exercise, sodium reduction diet.  Handout given on DASH diet.  She would like to go ahead and start medications.  We had given her option of 3 months of lifestyle management first but elected after discussion to start amlodipine 5 mg once daily.  Recommend follow-up in 1 month to reassess.  We did discuss the fact that if she is able to lose her weight back down and she might  actually build to come back off this at some point   Return in about 4 weeks (around 12/25/2022).    Carolann Littler, MD

## 2024-01-20 ENCOUNTER — Ambulatory Visit: Payer: Self-pay

## 2024-01-20 ENCOUNTER — Ambulatory Visit: Admitting: Family Medicine

## 2024-01-20 ENCOUNTER — Encounter: Payer: Self-pay | Admitting: Family Medicine

## 2024-01-20 VITALS — BP 160/90 | HR 104 | Temp 98.4°F | Ht 65.35 in | Wt 185.2 lb

## 2024-01-20 DIAGNOSIS — J069 Acute upper respiratory infection, unspecified: Secondary | ICD-10-CM | POA: Diagnosis not present

## 2024-01-20 DIAGNOSIS — R062 Wheezing: Secondary | ICD-10-CM

## 2024-01-20 DIAGNOSIS — J0100 Acute maxillary sinusitis, unspecified: Secondary | ICD-10-CM

## 2024-01-20 MED ORDER — IPRATROPIUM-ALBUTEROL 0.5-2.5 (3) MG/3ML IN SOLN
3.0000 mL | Freq: Once | RESPIRATORY_TRACT | Status: AC
  Administered 2024-01-20: 3 mL via RESPIRATORY_TRACT

## 2024-01-20 MED ORDER — AMOXICILLIN-POT CLAVULANATE 500-125 MG PO TABS: 1.0000 | ORAL_TABLET | Freq: Two times a day (BID) | ORAL | 0 refills | Status: AC

## 2024-01-20 NOTE — Telephone Encounter (Signed)
 Copied from CRM [id number]. Topic: Clinical - Red Word Triage >> Jan 20, 2024  1:52 PM Alysia Jumbo S wrote: [facility] that prompted transfer to Nurse Triage: congestion, wheezing, sob, cough w/ phlegm  Chief Complaint: cough, wheezing Symptoms: cough with yellow sputum, wheezing Frequency:started Tuesday  Pertinent Negatives: Patient denies fever, cp Disposition: [] ED /[] Urgent Care (no appt availability in office) / [x] Appointment(In office/virtual)/ []  [facility]/ [] Home Care/ [] Refused Recommended Disposition /[] [facility]/ []  Follow-up with PCP Additional Notes: per protocol apt made for today; care advice given, denies questions; instructed to go to ER if becomes worse.   Reason for Disposition  [1] MILD difficulty breathing (e.g., minimal/no SOB at rest, SOB with walking, pulse <100) AND [2] still present when not coughing  Answer Assessment - Initial Assessment Questions 1. ONSET: "When did the cough begin?"      Tuesday 2. SEVERITY: "How bad is the cough today?"      moderate 3. SPUTUM: "Describe the color of your sputum" (none, dry cough; clear, white, yellow, green)     yellow 4. HEMOPTYSIS: "Are you coughing up any blood?" If so ask: "How much?" (flecks, streaks, tablespoons, etc.)     no 5. DIFFICULTY BREATHING: "Are you having difficulty breathing?" If Yes, ask: "How bad is it?" (e.g., mild, moderate, severe)    - MILD: No SOB at rest, mild SOB with walking, speaks normally in sentences, can lie down, no retractions, pulse < 100.    - MODERATE: SOB at rest, SOB with minimal exertion and prefers to sit, cannot lie down flat, speaks in phrases, mild retractions, audible wheezing, pulse 100-120.    - SEVERE: Very SOB at rest, speaks in single words, struggling to breathe, sitting hunched forward, retractions, pulse > 120      moderate 6. FEVER: "Do you have a fever?" If Yes, ask: "What is your temperature, how was it measured, and when did it start?"     no 7.  CARDIAC HISTORY: "Do you have any history of heart disease?" (e.g., heart attack, congestive heart failure)      no 8. LUNG HISTORY: "Do you have any history of lung disease?"  (e.g., pulmonary embolus, asthma, emphysema)     no 9. PE RISK FACTORS: "Do you have a history of blood clots?" (or: recent major surgery, recent prolonged travel, bedridden)     no 10. OTHER SYMPTOMS: "Do you have any other symptoms?" (e.g., runny nose, wheezing, chest pain)       Wheezing,  11. PREGNANCY: "Is there any chance you are pregnant?" "When was your last menstrual period?"       na 12. TRAVEL: "Have you traveled out of the country in the last month?" (e.g., travel history, exposures)       no  Protocols used: Cough - Acute Productive-A-AH

## 2024-01-20 NOTE — Progress Notes (Signed)
 Established Patient Office Visit   Subjective  Patient ID: Jean English, female    DOB: [birth year]  Age: 53 y.o. MRN: [id number]  Chief Complaint  Patient presents with   Cough    Congestion, cough, wheezing, runny nose, vomiting, started a week ago     Patient is a 53 year old female followed by Dr. Francetta Innocent and seen for acute concern.  Patient endorses URI symptoms starting 1 week ago with headache, pressure in face, stuffy nose, rhinorrhea, sore throat from postnasal drainage.  Symptoms have now gone into her chest with coughing and wheezing.  Endorses episode of posttussive emesis this morning.  Patient tried DayQuil, NyQuil, Aleve, nasal spray for symptoms.    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/2014   SUI (stress urinary incontinence, female) 05/25/2011   Past Medical History:  Diagnosis Date   Anemia    hx=2015-2017   Arthritis    Right shoulder, collar area   Endometriosis    laparoscopy   Fibroid uterus 1998   Incontinence    Shortness of breath dyspnea    Urinary frequency    Past Surgical History:  Procedure Laterality Date   ABDOMINAL HYSTERECTOMY  2016   CESAREAN SECTION     X 2   cortisone injection right shoulder     HYSTEROSCOPY  10.10.12   W/REMOVAL OF POLYP   Iron Infusion     KNEE ARTHROSCOPY  1995   RIGHT    PELVIC LAPAROSCOPY  1996   FOR ENDOMETRIOSIS   TUBAL LIGATION  01/21/2004   Social History   Tobacco Use   Smoking status: Former    Current packs/day: 0.00    Average packs/day: 0.1 packs/day for 0.5 years    Types: Cigarettes    Start date: 07/18/2009    Quit date: 01/16/2010    Years since quitting: 14.0   Smokeless tobacco: Never  Vaping Use   Vaping status: Never Used   Substance Use Topics   Alcohol use: Yes    Alcohol/week: 0.0 standard drinks of alcohol    Comment: OCCASIONALLY   Drug use: No   Family History  Problem Relation Age of Onset   Hypertension Mother    Diabetes Mother    Arthritis Mother    Hyperlipidemia Mother    Kidney disease Mother    Colon polyps Mother 2   Hyperlipidemia Father    Hypertension Father    Hypertension Sister    Colon polyps Sister 54   Breast cancer Maternal Aunt 74       DECEASED   Cancer Maternal Aunt        LUNG   Cancer Maternal Uncle 67       LUNG   Diabetes Maternal Grandmother    Early death Maternal Grandmother    Stroke Maternal Grandmother    Cancer Maternal Grandfather    Early death Paternal Grandmother    Stroke Paternal Grandmother    Drug abuse Paternal Grandfather    Colon cancer Neg Hx    Esophageal cancer Neg Hx    Rectal cancer Neg Hx    Stomach cancer Neg Hx    No Known Allergies    ROS Negative unless stated above    Objective:  BP (!) 160/90 (BP Location: Right Arm, Patient Position: Sitting, Cuff Size: Normal)   Pulse (!) 104   Temp 98.4 F (36.9 C) (Oral)   Ht 5' 5.35" (1.66 m)   Wt 185 lb 3.2 oz (84 kg)   LMP 12/02/2015   SpO2 94%   BMI 30.49 kg/m  BP Readings from Last 3 Encounters:  01/20/24 (!) 160/90  11/27/22 138/88  05/19/21 120/70   Wt Readings from Last 3 Encounters:  01/20/24 185 lb 3.2 oz (84 kg)  11/27/22 181 lb 1.6 oz (82.1 kg)  05/19/21 160 lb 2 oz (72.6 kg)      Physical Exam Constitutional:      Appearance: Normal appearance.  HENT:     Head: Normocephalic and atraumatic.     Nose: Nose normal.  Cardiovascular:     Rate and Rhythm: Tachycardia present.  Pulmonary:     Effort: Pulmonary effort is normal. No respiratory distress.     Breath sounds: Examination of the right-upper field reveals wheezing. Examination of the right-lower field reveals wheezing. Examination of the left-lower field reveals wheezing. Wheezing  present. No decreased breath sounds.     Comments: Faint scattered wheezes throughout remained after DuoNeb. Skin:    General: Skin is warm and dry.     No results found for any visits on 01/20/24.    Assessment & Plan:  Acute maxillary sinusitis, recurrence not specified -     Amoxicillin-Pot Clavulanate; Take 1 tablet by mouth in the morning and at bedtime for 7 days.  Dispense: 14 tablet; Refill: 0  Upper respiratory infection with cough and congestion -     Ipratropium-Albuterol  Wheezing -     Ipratropium-Albuterol  Patient with acute URI symptoms with cough and wheezing.  Given DuoNeb in clinic.  Faint wheezing still present at end of treatment.  Start ABX for sinusitis.  Continue supportive care with OTC cough/cold medications.  Given strict precautions.  Return if symptoms worsen or fail to improve.   Viola Greulich, MD

## 2024-07-10 ENCOUNTER — Encounter: Payer: Self-pay | Admitting: Family Medicine

## 2024-07-10 ENCOUNTER — Ambulatory Visit: Admitting: Family Medicine

## 2024-07-10 VITALS — BP 168/110 | HR 85 | Temp 98.1°F | Wt 191.5 lb

## 2024-07-10 DIAGNOSIS — D500 Iron deficiency anemia secondary to blood loss (chronic): Secondary | ICD-10-CM

## 2024-07-10 DIAGNOSIS — Z833 Family history of diabetes mellitus: Secondary | ICD-10-CM | POA: Diagnosis not present

## 2024-07-10 DIAGNOSIS — I10 Essential (primary) hypertension: Secondary | ICD-10-CM

## 2024-07-10 DIAGNOSIS — L659 Nonscarring hair loss, unspecified: Secondary | ICD-10-CM | POA: Diagnosis not present

## 2024-07-10 DIAGNOSIS — Z23 Encounter for immunization: Secondary | ICD-10-CM

## 2024-07-10 DIAGNOSIS — E785 Hyperlipidemia, unspecified: Secondary | ICD-10-CM

## 2024-07-10 LAB — BASIC METABOLIC PANEL WITH GFR
BUN: 12 mg/dL (ref 6–23)
CO2: 23 meq/L (ref 19–32)
Calcium: 10 mg/dL (ref 8.4–10.5)
Chloride: 99 meq/L (ref 96–112)
Creatinine, Ser: 0.94 mg/dL (ref 0.40–1.20)
GFR: 69.63 mL/min (ref >60.00)
Glucose, Bld: 96 mg/dL (ref 70–99)
Potassium: 4.1 meq/L (ref 3.5–5.1)
Sodium: 134 meq/L — ABNORMAL LOW (ref 135–145)

## 2024-07-10 LAB — CBC WITH DIFFERENTIAL/PLATELET
Basophils Absolute: 0 K/uL (ref 0.0–0.1)
Basophils Relative: 0.6 % (ref 0.0–3.0)
Eosinophils Absolute: 0.1 K/uL (ref 0.0–0.7)
Eosinophils Relative: 1.4 % (ref 0.0–5.0)
HCT: 41.4 % (ref 36.0–46.0)
Hemoglobin: 13.6 g/dL (ref 12.0–15.0)
Lymphocytes Relative: 42.7 % (ref 12.0–46.0)
Lymphs Abs: 2.3 K/uL (ref 0.7–4.0)
MCHC: 32.9 g/dL (ref 30.0–36.0)
MCV: 98.5 fl (ref 78.0–100.0)
Monocytes Absolute: 0.6 K/uL (ref 0.1–1.0)
Monocytes Relative: 11.1 % (ref 3.0–12.0)
Neutro Abs: 2.4 K/uL (ref 1.4–7.7)
Neutrophils Relative %: 44.2 % (ref 43.0–77.0)
Platelets: 380 K/uL (ref 150.0–400.0)
RBC: 4.2 Mil/uL (ref 3.87–5.11)
RDW: 13.6 % (ref 11.5–15.5)
WBC: 5.4 K/uL (ref 4.0–10.5)

## 2024-07-10 LAB — LIPID PANEL
Cholesterol: 264 mg/dL — ABNORMAL HIGH (ref 0–200)
HDL: 102.9 mg/dL (ref >39.00)
LDL Cholesterol: 131 mg/dL — ABNORMAL HIGH (ref 0–99)
NonHDL: 161.52
Total CHOL/HDL Ratio: 3
Triglycerides: 152 mg/dL — ABNORMAL HIGH (ref 0.0–149.0)
VLDL: 30.4 mg/dL (ref 0.0–40.0)

## 2024-07-10 LAB — TSH: TSH: 0.86 u[IU]/mL (ref 0.35–5.50)

## 2024-07-10 LAB — VITAMIN D 25 HYDROXY (VIT D DEFICIENCY, FRACTURES): VITD: 40.82 ng/mL (ref 30.00–100.00)

## 2024-07-10 LAB — HEPATIC FUNCTION PANEL
ALT: 18 U/L (ref 0–35)
AST: 23 U/L (ref 0–37)
Albumin: 4.6 g/dL (ref 3.5–5.2)
Alkaline Phosphatase: 55 U/L (ref 39–117)
Bilirubin, Direct: 0.2 mg/dL (ref 0.0–0.3)
Total Bilirubin: 0.7 mg/dL (ref 0.2–1.2)
Total Protein: 7.7 g/dL (ref 6.0–8.3)

## 2024-07-10 LAB — HEMOGLOBIN A1C: Hgb A1c MFr Bld: 5.3 % (ref 4.6–6.5)

## 2024-07-10 MED ORDER — WEGOVY 0.5 MG/0.5ML ~~LOC~~ SOAJ: 0.5000 mg | SUBCUTANEOUS | 2 refills | Status: DC

## 2024-07-10 MED ORDER — CHLORTHALIDONE 25 MG PO TABS: 25.0000 mg | ORAL_TABLET | Freq: Every day | ORAL | 5 refills | Status: AC

## 2024-07-10 NOTE — Progress Notes (Signed)
 Established Patient Office Visit  Subjective   Patient ID: Jean English, female    DOB: 12/02/1970  Age: 53 y.o. MRN: [id number]  Chief Complaint  Patient presents with   Medical Management of Chronic Issues    HPI   Jean English is seen for medical follow-up.  She has some recent chest congestion and has taken TheraFlu and NyQuil.  No fever.  Denies any significant dyspnea.  She has chronic problems including hypertension alopecia, endometriosis, hyperlipidemia.  She has had remote history of iron deficiency anemia probably secondary to fibroids.  Has not had any known anemia since her hysterectomy.  She had been taking amlodipine  5 mg daily for hypertension but apparently ran out.  She has had some gradual weight gain which she thinks is partly postmenopausal.  She has had some ongoing alopecia issues.  She states she was treated with [facility] through work program and got up to 0.5 mg weekly but did not see any significant weight loss.  She has been off for 3 weeks now.  Positive family history of type 2 diabetes in her mom.  Jean English is requesting diabetic screening follow-up.  Her A1c's have been normal in the past.  She has been exercising regularly at the gym but still not losing any weight.  Past Medical History:  Diagnosis Date   Anemia    hx=2015-2017   Arthritis    Right shoulder, collar area   Endometriosis    laparoscopy   Fibroid uterus 1998   Incontinence    Shortness of breath dyspnea    Urinary frequency    Past Surgical History:  Procedure Laterality Date   ABDOMINAL HYSTERECTOMY  2016   CESAREAN SECTION     X 2   cortisone injection right shoulder     HYSTEROSCOPY  10.10.12   W/REMOVAL OF POLYP   Iron Infusion     KNEE ARTHROSCOPY  1995   RIGHT    PELVIC LAPAROSCOPY  1996   FOR ENDOMETRIOSIS   TUBAL LIGATION  01/21/2004    reports that she quit smoking about 14 years ago. Her smoking use included cigarettes. She started smoking about 14 years ago. She  smoked an average 0.1 packs per day for 0.5 years. She has never used smokeless tobacco. She reports current alcohol use. She reports that she does not use drugs. family history includes Arthritis in her mother; Breast cancer (age of onset: 53) in her maternal aunt; Cancer in her maternal aunt and maternal grandfather; Cancer (age of onset: 54) in her maternal uncle; Colon polyps (age of onset: 33) in her mother; Colon polyps (age of onset: 74) in her sister; Diabetes in her maternal grandmother and mother; Drug abuse in her paternal grandfather; Early death in her maternal grandmother and paternal grandmother; Hyperlipidemia in her father and mother; Hypertension in her father, mother, and sister; Kidney disease in her mother; Stroke in her maternal grandmother and paternal grandmother. No Known Allergies  Review of Systems  Constitutional:  Negative for chills, fever and malaise/fatigue.  Eyes:  Negative for blurred vision.  Respiratory:  Positive for cough. Negative for shortness of breath.   Cardiovascular:  Negative for chest pain.  Neurological:  Negative for dizziness, weakness and headaches.      Objective:     BP (!) 160/100   Pulse 85   Temp 98.1 F (36.7 C) (Oral)   Wt 191 lb 8 oz (86.9 kg)   LMP 12/02/2015   SpO2 97%   BMI 31.53  kg/m  BP Readings from Last 3 Encounters:  07/10/24 (!) 160/100  01/20/24 (!) 160/90  11/27/22 138/88   Wt Readings from Last 3 Encounters:  07/10/24 191 lb 8 oz (86.9 kg)  01/20/24 185 lb 3.2 oz (84 kg)  11/27/22 181 lb 1.6 oz (82.1 kg)      Physical Exam Vitals reviewed.  Constitutional:      General: She is not in acute distress.    Appearance: She is well-developed. She is not ill-appearing.  Eyes:     Pupils: Pupils are equal, round, and reactive to light.  Neck:     Thyroid : No thyromegaly.     Vascular: No JVD.  Cardiovascular:     Rate and Rhythm: Normal rate and regular rhythm.     Heart sounds:     No gallop.   Pulmonary:     Effort: Pulmonary effort is normal. No respiratory distress.     Breath sounds: Normal breath sounds. No wheezing or rales.  Musculoskeletal:     Cervical back: Neck supple.  Neurological:     Mental Status: She is alert.      No results found for any visits on 07/10/24.    The ASCVD Risk score (Arnett DK, et al., 2019) failed to calculate for the following reasons:   The valid HDL cholesterol range is 20 to 100 mg/dL    Assessment & Plan:   Problem List Items Addressed This Visit       Unprioritized   Essential hypertension - Primary   Relevant Medications   chlorthalidone (HYGROTON) 25 MG tablet   Other Relevant Orders   Basic metabolic panel with GFR   Alopecia   Relevant Orders   TSH   VITAMIN D 25 Hydroxy (Vit-D Deficiency, Fractures)   Iron deficiency anemia due to chronic blood loss   Relevant Orders   CBC with Differential/Platelet   Other Visit Diagnoses       Hyperlipidemia, unspecified hyperlipidemia type       Relevant Medications   chlorthalidone (HYGROTON) 25 MG tablet   Other Relevant Orders   Lipid panel   Hepatic function panel     Family history of type 2 diabetes mellitus       Relevant Orders   Hemoglobin A1c     Need for influenza vaccination       Relevant Orders   Flu vaccine trivalent PF, 6mos and older(Flulaval,Afluria,Fluarix,Fluzone) (Completed)     Multiple medical issues as above.  She has had some gradual weight gain.  Had been started at her place of employment on Wegovy and is hoping to continue with that.  She has had some generalized alopecia.  We discussed checking multiple labs including labs as above  -Refill Wegovy 0.5 mg subcutaneous weekly. - Patient did report some increased snoring with her weight gain and if insurance fails to cover [facility] for weight loss consider home sleep study to rule out apnea - Blood pressure was quite high and we recommended starting back medication with chlorthalidone 25 mg  once daily. - Recommend low-sodium diet.  Set up 1 month follow-up.  If blood pressure not to goal at that point consider adding back calcium channel blocker such as amlodipine .  Return in about 1 month (around 08/10/2024).    Jean Scarlet, MD

## 2024-07-10 NOTE — Patient Instructions (Signed)
 Be sure to set up one month follow up.

## 2024-07-12 ENCOUNTER — Ambulatory Visit: Payer: Self-pay | Admitting: Family Medicine

## 2024-07-13 ENCOUNTER — Telehealth: Payer: Self-pay

## 2024-07-13 NOTE — Telephone Encounter (Signed)
 Copied from CRM [id number]. Topic: Clinical - Lab/Test Results >> Jul 13, 2024  9:53 AM Terri MATSU wrote: Reason for CRM: Patient returning call for lab results.

## 2024-07-13 NOTE — Telephone Encounter (Signed)
 Please see result note

## 2024-08-12 ENCOUNTER — Other Ambulatory Visit: Payer: Self-pay | Admitting: Family Medicine

## 2024-08-12 NOTE — Telephone Encounter (Signed)
 Copied from CRM #[id number]. Topic: Clinical - Medication Refill >> Aug 12, 2024 12:15 PM Jean English wrote: Medication: semaglutide -weight management (WEGOVY ) 0.5 MG/0.5ML SOAJ SQ injection    Has the patient contacted their pharmacy? No She called us  because they said she needed a authorization.   This is the patient's preferred pharmacy:  [id number] [place], [place] - [address] AT [facility] OF [facility] [address] [facility] [address] [place] [address] Phone: [phone] Fax: [phone]  Is this the correct pharmacy for this prescription? Yes, If no, delete pharmacy and type the correct one.   Has the prescription been filled recently? Yes  Is the patient out of the medication? Yes, she has an apt on 12/01 but she needs some get her to 12/02   Has the patient been seen for an appointment in the last year OR does the patient have an upcoming appointment? Yes  Can we respond through MyChart? No  Agent: Please be advised that Rx refills may take up to 3 business days. We ask that you follow-up with your pharmacy.

## 2024-08-16 MED ORDER — WEGOVY 0.5 MG/0.5ML ~~LOC~~ SOAJ: 0.5000 mg | SUBCUTANEOUS | 2 refills | Status: DC

## 2024-08-17 ENCOUNTER — Ambulatory Visit: Admitting: Family Medicine

## 2024-08-17 ENCOUNTER — Ambulatory Visit: Payer: Self-pay | Admitting: Family Medicine

## 2024-08-17 ENCOUNTER — Encounter: Payer: Self-pay | Admitting: Family Medicine

## 2024-08-17 VITALS — BP 128/88 | HR 83 | Temp 97.4°F | Ht 65.53 in | Wt 187.5 lb

## 2024-08-17 DIAGNOSIS — I10 Essential (primary) hypertension: Secondary | ICD-10-CM

## 2024-08-17 DIAGNOSIS — M7062 Trochanteric bursitis, left hip: Secondary | ICD-10-CM

## 2024-08-17 LAB — BASIC METABOLIC PANEL WITH GFR
BUN: 16 mg/dL (ref 6–23)
CO2: 28 meq/L (ref 19–32)
Calcium: 10.3 mg/dL (ref 8.4–10.5)
Chloride: 96 meq/L (ref 96–112)
Creatinine, Ser: 1.09 mg/dL (ref 0.40–1.20)
GFR: 58.25 mL/min — ABNORMAL LOW (ref >60.00)
Glucose, Bld: 100 mg/dL — ABNORMAL HIGH (ref 70–99)
Potassium: 3.9 meq/L (ref 3.5–5.1)
Sodium: 136 meq/L (ref 135–145)

## 2024-08-17 NOTE — Progress Notes (Signed)
 Established Patient Office Visit  Subjective   Patient ID: Jean English, female    DOB: 06-Jul-1971  Age: 53 y.o. MRN: [id number]  Chief Complaint  Patient presents with   Medical Management of Chronic Issues    Pt is f/u on BP.    Hip Pain    Pt reports hip pain on L side when workout, doing a lot of walk in. Noticed it for a while, it would go away but now the pain is more nagging.    HPI   Jean English is here for follow-up hypertension.  Refer to last note for details.  Restarted chlorthalidone  25 mg daily.  Tolerating without side effects.  Not monitoring blood pressures at home.  Denies any dizziness or lightheadedness.  She is trying to watch her sodium intake closely.  She has lost a few more pounds.  She is currently on GLP-1 through work related program.  She does relate second issue today of left lateral hip pain.  Worse at times with exercise especially when she first starts exercising but does improve some after she warms up.  Location is lateral hip.  No radiculitis symptoms.  No lower extremity numbness or weakness.  No low back pain.  Pain is centered over greater trochanteric bursa.  Past Medical History:  Diagnosis Date   Anemia    hx=2015-2017   Arthritis    Right shoulder, collar area   Endometriosis    laparoscopy   Fibroid uterus 1998   Incontinence    Shortness of breath dyspnea    Urinary frequency    Past Surgical History:  Procedure Laterality Date   ABDOMINAL HYSTERECTOMY  2016   CESAREAN SECTION     X 2   cortisone injection right shoulder     HYSTEROSCOPY  10.10.12   W/REMOVAL OF POLYP   Iron Infusion     KNEE ARTHROSCOPY  1995   RIGHT    PELVIC LAPAROSCOPY  1996   FOR ENDOMETRIOSIS   TUBAL LIGATION  01/21/2004    reports that she quit smoking about 14 years ago. Her smoking use included cigarettes. She started smoking about 15 years ago. She smoked an average 0.1 packs per day for 0.5 years. She has never used smokeless tobacco. She  reports current alcohol use. She reports that she does not use drugs. family history includes Arthritis in her mother; Breast cancer (age of onset: 63) in her maternal aunt; Cancer in her maternal aunt and maternal grandfather; Cancer (age of onset: 15) in her maternal uncle; Colon polyps (age of onset: 29) in her mother; Colon polyps (age of onset: 27) in her sister; Diabetes in her maternal grandmother and mother; Drug abuse in her paternal grandfather; Early death in her maternal grandmother and paternal grandmother; Hyperlipidemia in her father and mother; Hypertension in her father, mother, and sister; Kidney disease in her mother; Stroke in her maternal grandmother and paternal grandmother. No Known Allergies  Review of Systems  Constitutional:  Negative for malaise/fatigue.  Eyes:  Negative for blurred vision.  Respiratory:  Negative for shortness of breath.   Cardiovascular:  Negative for chest pain.  Neurological:  Negative for dizziness, weakness and headaches.      Objective:     BP 128/88 (BP Location: Left Arm, Cuff Size: Normal)   Pulse 83   Temp (!) 97.4 F (36.3 C) (Oral)   Ht 5' 5.53 (1.664 m)   Wt 187 lb 8 oz (85 kg)   LMP 12/02/2015  SpO2 96%   BMI 30.70 kg/m  BP Readings from Last 3 Encounters:  08/17/24 128/88  07/10/24 (!) 168/110  01/20/24 (!) 160/90   Wt Readings from Last 3 Encounters:  08/17/24 187 lb 8 oz (85 kg)  07/10/24 191 lb 8 oz (86.9 kg)  01/20/24 185 lb 3.2 oz (84 kg)      Physical Exam Vitals reviewed.  Constitutional:      Appearance: She is well-developed.  Eyes:     Pupils: Pupils are equal, round, and reactive to light.  Neck:     Thyroid : No thyromegaly.     Vascular: No JVD.  Cardiovascular:     Rate and Rhythm: Normal rate and regular rhythm.     Heart sounds:     No gallop.  Pulmonary:     Effort: Pulmonary effort is normal. No respiratory distress.     Breath sounds: Normal breath sounds. No wheezing or rales.   Musculoskeletal:     Cervical back: Neck supple.     Comments: Left hip reveals excellent range of motion.  She has some mild tenderness over the greater trochanteric bursa.  Neurological:     Mental Status: She is alert.      No results found for any visits on 08/17/24.    The ASCVD Risk score (Arnett DK, et al., 2019) failed to calculate for the following reasons:   The valid HDL cholesterol range is 20 to 100 mg/dL    Assessment & Plan:   Problem List Items Addressed This Visit       Unprioritized   Essential hypertension - Primary   Relevant Orders   Basic metabolic panel with GFR   Other Visit Diagnoses       Trochanteric bursitis of left hip         Hypertension improved with recent addition of chlorthalidone  25 mg daily.  Reminder for high potassium and low sodium diet.  Check basic metabolic panel with recent initiation of chlorthalidone .  Hopefully, diastolic blood pressure will improve further with some additional weight loss.  Reassess in 3 months  Regarding her bursitis recommend icing after workouts and consider trial of over-the-counter topical diclofenac/Voltaren gel.  Consider steroid injection if not improving with the above over the next few weeks  Return in about 3 months (around 11/15/2024).    Wolm Scarlet, MD

## 2024-08-17 NOTE — Patient Instructions (Signed)
 Consider trial of over the counter Voltaren/Diclofenac gel.

## 2024-09-30 ENCOUNTER — Ambulatory Visit: Payer: Self-pay

## 2024-09-30 ENCOUNTER — Encounter: Payer: Self-pay | Admitting: Family Medicine

## 2024-09-30 ENCOUNTER — Ambulatory Visit: Admitting: Family Medicine

## 2024-09-30 VITALS — BP 116/76 | HR 94 | Temp 97.9°F | Wt 193.0 lb

## 2024-09-30 DIAGNOSIS — H00011 Hordeolum externum right upper eyelid: Secondary | ICD-10-CM | POA: Diagnosis not present

## 2024-09-30 DIAGNOSIS — E66811 Obesity, class 1: Secondary | ICD-10-CM | POA: Diagnosis not present

## 2024-09-30 NOTE — Telephone Encounter (Signed)
 FYI Only or Action Required?: FYI only for provider: appointment scheduled on 10/01/23.  Patient was last seen in primary care on 08/17/2024 by Micheal Wolm ORN, MD.  Called Nurse Triage reporting Eye Problem.  Symptoms began yesterday.  Interventions attempted: Rest, hydration, or home remedies.  Symptoms are: gradually worsening.  Triage Disposition: See HCP Within 4 Hours (Or PCP Triage)  Patient/caregiver understands and will follow disposition?: Yes    Copied from CRM [id number]. Topic: Clinical - Red Word Triage >> Sep 30, 2024  8:29 AM Lonell PEDLAR wrote: Red Word that prompted transfer to Nurse Triage: Patient with worsening eye pain/soreness. Reason for Disposition  [1] SEVERE eyelid swelling on one side AND [2] red and painful (or tender to touch)  Answer Assessment - Initial Assessment Questions 1. ONSET: When did the swelling start? (e.g., minutes, hours, days)     Began yesterday 2. LOCATION: What part of the eyelids is swollen?     Upper eyelid 3. SEVERITY: How swollen is it?     Pt reports can see but very swollen 4. ITCHING: Is there any itching? If Yes, ask: How much?   (Scale 1-10; mild, moderate or severe)     Itching in corner of eye 5. PAIN: Is the swelling painful to touch? If Yes, ask: How painful is it?   (Scale 1-10; mild, moderate or severe)     Pt reports sore 6. FEVER: Do you have a fever? If Yes, ask: What is it, how was it measured, and when did it start?      None 7. CAUSE: What do you think is causing the swelling?     Unknown, pt believes may be pink eye 8. RECURRENT SYMPTOM: Have you had eyelid swelling before? If Yes, ask: When was the last time? What happened that time?     None, stye as a child 9. OTHER SYMPTOMS: Do you have any other symptoms? (e.g., blurred vision, eye discharge, rash, runny nose)     Yellow/white discharge  Protocols used: Eye - Swelling-A-AH

## 2024-09-30 NOTE — Progress Notes (Signed)
 "  Established Patient Office Visit  Subjective   Patient ID: Jean English, female    DOB: 30-Aug-1971  Age: 54 y.o. MRN: [id number]  Chief Complaint  Patient presents with   Eye Problem    HPI    Aveena is seen with swelling and some mild itching of the right upper eyelid 1 day duration.  Denies any significant eye pain other than some mild irritation of the lid.  No blurred vision.  Denies any eye injury.  No contact use.  No past history of styes.  Minimal crusted drainage.  Swelling was much worse this morning and has improved gradually some through the day  She recently went on Wegovy  and initially at 0.5 mg dose had 5 pounds of weight loss but none since then.  She would like to consider further titration after she finishes up this next month.  Past Medical History:  Diagnosis Date   Anemia    hx=2015-2017   Arthritis    Right shoulder, collar area   Endometriosis    laparoscopy   Fibroid uterus 1998   Incontinence    Shortness of breath dyspnea    Urinary frequency    Past Surgical History:  Procedure Laterality Date   ABDOMINAL HYSTERECTOMY  2016   CESAREAN SECTION     X 2   cortisone injection right shoulder     HYSTEROSCOPY  10.10.12   W/REMOVAL OF POLYP   Iron Infusion     KNEE ARTHROSCOPY  1995   RIGHT    PELVIC LAPAROSCOPY  1996   FOR ENDOMETRIOSIS   TUBAL LIGATION  01/21/2004    reports that she quit smoking about 14 years ago. Her smoking use included cigarettes. She started smoking about 15 years ago. She smoked an average 0.1 packs per day for 0.5 years. She has never used smokeless tobacco. She reports current alcohol use. She reports that she does not use drugs. family history includes Arthritis in her mother; Breast cancer (age of onset: 49) in her maternal aunt; Cancer in her maternal aunt and maternal grandfather; Cancer (age of onset: 64) in her maternal uncle; Colon polyps (age of onset: 7) in her mother; Colon polyps (age of onset: 65) in  her sister; Diabetes in her maternal grandmother and mother; Drug abuse in her paternal grandfather; Early death in her maternal grandmother and paternal grandmother; Hyperlipidemia in her father and mother; Hypertension in her father, mother, and sister; Kidney disease in her mother; Stroke in her maternal grandmother and paternal grandmother. Allergies[1]  Review of Systems  Constitutional:  Negative for chills and fever.  Eyes:  Negative for blurred vision and double vision.      Objective:     BP 116/76   Pulse 94   Temp 97.9 F (36.6 C) (Oral)   Wt 193 lb (87.5 kg)   LMP 12/02/2015   SpO2 95%   BMI 31.60 kg/m  BP Readings from Last 3 Encounters:  09/30/24 116/76  08/17/24 128/88  07/10/24 (!) 168/110   Wt Readings from Last 3 Encounters:  09/30/24 193 lb (87.5 kg)  08/17/24 187 lb 8 oz (85 kg)  07/10/24 191 lb 8 oz (86.9 kg)      Physical Exam Vitals reviewed.  Constitutional:      General: She is not in acute distress.    Appearance: She is not ill-appearing.  Eyes:     Comments: Right upper eyelid reveals some obvious swelling.  She has very small stye about midway  along the eyelid margin.  No conjunctivitis findings.  Cornea appears normal.  Cardiovascular:     Rate and Rhythm: Normal rate and regular rhythm.  Neurological:     Mental Status: She is alert.      No results found for any visits on 09/30/24.    The ASCVD Risk score (Arnett DK, et al., 2019) failed to calculate for the following reasons:   The valid HDL cholesterol range is 20 to 100 mg/dL    Assessment & Plan:   #1 stye involving right upper lid.  We explained limited if any role of topical antibiotics in treating this.  Recommend warm compresses several times daily.  This is usually self resolving.  Be in touch if not improving over the next week  #2 obesity.  Currently on Wegovy  0.5 mg.  Weight has plateaued.  After this month she would like to consider further titration to 1 mg. No  follow-ups on file.    Wolm Scarlet, MD     [1] No Known Allergies  "

## 2024-10-14 ENCOUNTER — Other Ambulatory Visit: Payer: Self-pay | Admitting: Family Medicine

## 2024-10-14 NOTE — Telephone Encounter (Signed)
 Patient called in, in regards to the denial for refill of  wegovy . Patient stated that she was told to call in to get prescription for higher dosage of 1mg  of wegovy . Patient would like to have increased dosage of 1mg  of wegovy  prescribed.

## 2024-10-21 ENCOUNTER — Telehealth: Payer: Self-pay

## 2024-10-21 NOTE — Telephone Encounter (Signed)
 Copied from CRM [id number]. Topic: Clinical - Prescription Issue >> Oct 21, 2024  2:18 PM Deleta RAMAN wrote: Reason for CRM: patient is calling regarding denial of medication refill. Previous message sent on 1/28 regarding same concerns please contact patient at [phone]

## 2024-10-22 MED ORDER — WEGOVY 1 MG/0.5ML ~~LOC~~ SOAJ: 1.0000 mg | SUBCUTANEOUS | 2 refills | Status: AC

## 2024-10-22 NOTE — Addendum Note (Signed)
 Addended by: METTA KRISTEN CROME on: 10/22/2024 09:11 AM   Modules accepted: Orders

## 2024-10-22 NOTE — Telephone Encounter (Signed)
 Rx sent.

## 2024-11-16 ENCOUNTER — Ambulatory Visit: Admitting: Family Medicine
